# Patient Record
Sex: Male | Born: 1940
Health system: Southern US, Community
[De-identification: ages and names within clinical notes are randomized; demographics above are authoritative.]

## PROBLEM LIST (undated history)

## (undated) DIAGNOSIS — Z9889 Other specified postprocedural states: Secondary | ICD-10-CM

## (undated) DIAGNOSIS — C189 Malignant neoplasm of colon, unspecified: Secondary | ICD-10-CM

## (undated) DIAGNOSIS — R079 Chest pain, unspecified: Secondary | ICD-10-CM

## (undated) DIAGNOSIS — E7800 Pure hypercholesterolemia, unspecified: Secondary | ICD-10-CM

## (undated) DIAGNOSIS — K219 Gastro-esophageal reflux disease without esophagitis: Secondary | ICD-10-CM

## (undated) DIAGNOSIS — Z8739 Personal history of other diseases of the musculoskeletal system and connective tissue: Secondary | ICD-10-CM

## (undated) DIAGNOSIS — Z8719 Personal history of other diseases of the digestive system: Secondary | ICD-10-CM

## (undated) DIAGNOSIS — I2510 Atherosclerotic heart disease of native coronary artery without angina pectoris: Secondary | ICD-10-CM

## (undated) DIAGNOSIS — I10 Essential (primary) hypertension: Secondary | ICD-10-CM

## (undated) HISTORY — DX: Malignant neoplasm of colon, unspecified: C18.9

## (undated) HISTORY — PX: APPENDECTOMY: SHX54

## (undated) HISTORY — DX: Chest pain, unspecified: R07.9

## (undated) HISTORY — PX: CORONARY ANGIOPLASTY: SHX604

## (undated) HISTORY — PX: HERNIA REPAIR: SHX51

## (undated) HISTORY — DX: Personal history of other diseases of the digestive system: Z87.19

## (undated) HISTORY — PX: OTHER SURGICAL HISTORY: SHX169

## (undated) HISTORY — PX: PARTIAL COLECTOMY: SHX5273

## (undated) HISTORY — PX: AMPUTATION OF REPLICATED TOES: SHX1136

## (undated) HISTORY — PX: CARDIAC CATHETERIZATION: SHX172

## (undated) HISTORY — PX: TONSILLECTOMY: SUR1361

## (undated) HISTORY — DX: Other specified postprocedural states: Z98.890

---

## 1997-11-16 DIAGNOSIS — C189 Malignant neoplasm of colon, unspecified: Secondary | ICD-10-CM

## 1997-11-16 HISTORY — DX: Malignant neoplasm of colon, unspecified: C18.9

## 2002-10-22 ENCOUNTER — Inpatient Hospital Stay (HOSPITAL_COMMUNITY): Admission: EM | Admit: 2002-10-22 | Discharge: 2002-10-31 | Payer: Self-pay | Admitting: Emergency Medicine

## 2002-10-22 ENCOUNTER — Encounter: Payer: Self-pay | Admitting: Emergency Medicine

## 2005-05-04 ENCOUNTER — Emergency Department (HOSPITAL_COMMUNITY): Admission: EM | Admit: 2005-05-04 | Discharge: 2005-05-04 | Payer: Self-pay | Admitting: Emergency Medicine

## 2009-01-18 ENCOUNTER — Ambulatory Visit: Payer: Self-pay | Admitting: Cardiovascular Disease

## 2009-01-18 ENCOUNTER — Inpatient Hospital Stay (HOSPITAL_COMMUNITY): Admission: AD | Admit: 2009-01-18 | Discharge: 2009-01-21 | Payer: Self-pay | Admitting: Cardiovascular Disease

## 2009-02-09 DIAGNOSIS — Z9889 Other specified postprocedural states: Secondary | ICD-10-CM | POA: Insufficient documentation

## 2009-02-09 DIAGNOSIS — R079 Chest pain, unspecified: Secondary | ICD-10-CM

## 2009-02-09 DIAGNOSIS — C189 Malignant neoplasm of colon, unspecified: Secondary | ICD-10-CM | POA: Insufficient documentation

## 2009-03-01 ENCOUNTER — Ambulatory Visit: Payer: Self-pay | Admitting: Cardiology

## 2009-04-04 ENCOUNTER — Ambulatory Visit: Payer: Self-pay | Admitting: Cardiology

## 2011-02-26 LAB — CBC
HCT: 41 % (ref 39.0–52.0)
HCT: 41 % (ref 39.0–52.0)
Hemoglobin: 14.4 g/dL (ref 13.0–17.0)
Hemoglobin: 14.5 g/dL (ref 13.0–17.0)
Hemoglobin: 14.5 g/dL (ref 13.0–17.0)
MCHC: 35.4 g/dL (ref 30.0–36.0)
MCHC: 35.4 g/dL (ref 30.0–36.0)
MCV: 93.6 fL (ref 78.0–100.0)
Platelets: 202 10*3/uL (ref 150–400)
Platelets: 211 10*3/uL (ref 150–400)
RBC: 4.34 MIL/uL (ref 4.22–5.81)
RDW: 13.2 % (ref 11.5–15.5)
RDW: 13.2 % (ref 11.5–15.5)
RDW: 13.5 % (ref 11.5–15.5)
WBC: 8.9 10*3/uL (ref 4.0–10.5)

## 2011-02-26 LAB — LIPID PANEL
HDL: 21 mg/dL — ABNORMAL LOW (ref >39)
Total CHOL/HDL Ratio: 8.6 RATIO
VLDL: 21 mg/dL (ref 0–40)

## 2011-02-26 LAB — DIFFERENTIAL
Basophils Absolute: 0 10*3/uL (ref 0.0–0.1)
Lymphocytes Relative: 26 % (ref 12–46)
Lymphs Abs: 2.3 10*3/uL (ref 0.7–4.0)
Monocytes Absolute: 0.9 10*3/uL (ref 0.1–1.0)
Neutro Abs: 5.5 10*3/uL (ref 1.7–7.7)

## 2011-02-26 LAB — BASIC METABOLIC PANEL
BUN: 6 mg/dL (ref 6–23)
BUN: 7 mg/dL (ref 6–23)
BUN: 9 mg/dL (ref 6–23)
CO2: 25 mEq/L (ref 19–32)
Calcium: 8.5 mg/dL (ref 8.4–10.5)
Calcium: 8.6 mg/dL (ref 8.4–10.5)
Calcium: 8.9 mg/dL (ref 8.4–10.5)
Chloride: 105 mEq/L (ref 96–112)
Chloride: 105 mEq/L (ref 96–112)
Creatinine, Ser: 0.87 mg/dL (ref 0.4–1.5)
GFR calc Af Amer: >60 mL/min (ref >60)
GFR calc Af Amer: >60 mL/min (ref >60)
GFR calc non Af Amer: >60 mL/min (ref >60)
GFR calc non Af Amer: >60 mL/min (ref >60)
GFR calc non Af Amer: >60 mL/min (ref >60)
Glucose, Bld: 102 mg/dL — ABNORMAL HIGH (ref 70–99)
Glucose, Bld: 107 mg/dL — ABNORMAL HIGH (ref 70–99)
Glucose, Bld: 111 mg/dL — ABNORMAL HIGH (ref 70–99)
Potassium: 3.5 mEq/L (ref 3.5–5.1)
Potassium: 3.9 mEq/L (ref 3.5–5.1)
Sodium: 135 mEq/L (ref 135–145)
Sodium: 138 mEq/L (ref 135–145)
Sodium: 138 mEq/L (ref 135–145)

## 2011-02-26 LAB — CARDIAC PANEL(CRET KIN+CKTOT+MB+TROPI)
CK, MB: 174.4 ng/mL — ABNORMAL HIGH (ref 0.3–4.0)
Relative Index: 16.6 — ABNORMAL HIGH (ref 0.0–2.5)
Relative Index: 2.5 (ref 0.0–2.5)
Total CK: 130 U/L (ref 7–232)
Troponin I: 16.7 ng/mL (ref 0.00–0.06)
Troponin I: 21.95 ng/mL (ref 0.00–0.06)
Troponin I: 3.69 ng/mL (ref 0.00–0.06)

## 2011-03-31 NOTE — Discharge Summary (Signed)
Brandon [name], Brandon [name]             ACCOUNT NO.:  [id number]   MEDICAL RECORD NO.:  [id number]          PATIENT TYPE:  INP   LOCATION:  2032                         FACILITY:  [facility]   PHYSICIAN:  Verne Carrow, MDDATE OF BIRTH:  07-01-1941   DATE OF ADMISSION:  01/18/2009  DATE OF DISCHARGE:  01/21/2009                               DISCHARGE SUMMARY   PRIMARY CARDIOLOGIST:  Verne Carrow, MD   PRIMARY CARE PHYSICIAN:  The patient has none.   PROCEDURES PERFORMED DURING HOSPITALIZATION:  Cardiac catheterization,  dated January 18, 2009, performed by Dr. Verne Carrow, revealing  triple-vessel native coronary artery disease with total occlusion of the  obtuse marginal 1 and severe stenosis of the obtuse marginal 2.  A.  Successful percutaneous coronary intervention and placement of a  drug-eluting stent XIENCE 2.5 x 12 mm to the obtuse marginal 2 and  obtuse marginal 1.   FINAL DISCHARGE DIAGNOSES:  1. Non-Q-wave myocardial infarction.  2. Coronary artery disease.      a.     Status post cardiac catheterization, dated January 18, 2009,       revealing left anterior descending serial 30% lesions proximal,       serial 40% lesions mid, plaque distal; diagonal 1, ostial 90%       small vessel circ; obtuse marginal 1, 100% occlusion, moderate       size; obtuse marginal 2 plaque in the proximal portion, 95%       stenosis in the distal vessel with diffuse disease beyond the       tight stenosis; right coronary artery 50% proximal stenosis,       serial 30% mid, large and dominant, serial 30% distal; and       posterior descending artery 60%, posterolateral plaquing noted.       No wall motion abnormality with an ejection fraction of 65%.  3. Tobacco abuse.  4. Recent hernia repair.  5. Colon cancer.   HOSPITAL COURSE:  This is a 70 year old Caucasian male with no prior  cardiac history who presented to [facility] complaining of chest  discomfort, radiating up  into his jaw with nausea and diaphoresis.  The  patient was seen at [facility] ER and found to have a troponin of 0.59  with a CK-MB of 31 with inferolateral ST depression in the EKG.  The  patient was transferred to [facility] after speaking with Charlton Haws by [facility] ER physician.  The patient was started on  nitroglycerin.  On arrival, the patient continued to have jaw pain and  chest pain and the patient was sent to cardiac catheterization  emergently after being assessed by Dorian Pod, nurse practitioner.  Dr. Clifton James did perform cardiac catheterization with results described  above.  The patient tolerated the procedure well without evidence of  bleeding, hematoma, or infection.  The patient had good recovery post  catheterization.  The patient is being seen by cardiac rehab prior to  discharge and will follow up with them as an outpatient.  The patient  was seen and examined by Dr. Verne Carrow on day of  discharge.  The patient was without chest pain.  He will follow up with Dr. Clifton James  in his office in 3 weeks.  He will be placed on aspirin, Plavix,  Lopressor, and Crestor prior to discharge.  He has been counseled on  smoking cessation as well.   DISCHARGE LABORATORY DATA:  Sodium 135, potassium 3.5, chloride 105, CO2  27, BUN 9, creatinine 0.94, glucose 91.  CK 130, troponin 3.69, peak  troponin 21.95.   DISCHARGE VITAL SIGNS:  Blood pressure 105/53, pulse 52, respirations  16, temperature 97.9, O2 sat 93% on room air.   Discharge EKG revealing normal sinus rhythm without evidence of  ischemia.   DISCHARGE MEDICATIONS:  1. Plavix 75 mg daily (new prescription provided).  2. Aspirin 325 daily.  3. Lopressor 25 mg twice a day (new prescription provided).  4. Crestor 40 mg daily.  5. Nitroglycerin 0.4 mg daily p.r.n. chest discomfort.   ALLERGIES:  No known drug allergies.   FOLLOWUP PLANS AND APPOINTMENTS:  1. The patient is scheduled with Dr.  Verne Carrow on February 14, 2009, at 9:30.  2. The patient has been given post cardiac catheterization      instructions with particular emphasis on the right groin site for      evidence of bleeding, hematoma, or signs of infection.  3. The patient has been given smoking cessation counseling.  4. The patient will need to have lipids and LFTs drawn in 6 months      with institution of Crestor.  5. The patient will follow up with cardiac rehab as an outpatient.  6. The patient will need to find primary care physician for continued      medical management.   Time spent with the patient to include physician time 35 minutes.      Bettey Mare. Lyman Bishop, NP      Verne Carrow, MD  Electronically Signed    KML/MEDQ  D:  01/21/2009  T:  01/21/2009  Job:  [id number]

## 2011-03-31 NOTE — Assessment & Plan Note (Signed)
[facility]                          [facility] OFFICE NOTE   Brandon [name], Brandon [name]                    MRN:          [id number]  DATE:04/04/2009                            DOB:          08-Dec-1940    PRIMARY CARE PHYSICIAN:  None.   REASON FOR VISIT:  Scheduled followup.   HISTORY OF PRESENT ILLNESS:  Brandon [name] returns for a followup visit,  having last been seen in April.  He reports overall doing fairly well  without any active chest pain.  He has not had to use any nitroglycerin.  He states has been exercising perhaps 20 minutes a day without  limitation.  He reports compliance with his medication and still prefers  a fairly minimal approach to advancing medical therapy.  He had a  followup lipid profile and liver function tests, which revealed a total  cholesterol 81, triglycerides 84, HDL 19 and LDL 45.  His liver function  tests were normal.  I reviewed these with him today.   ALLERGIES:  No known drug allergies.   MEDICATIONS:  1. Aspirin 325 mg p.o. daily.  2. Plavix 75 mg p.o. daily.  3. Crestor 40 mg p.o. daily.  4. Lopressor 12.5 mg p.o. b.i.d.  5. Sublingual nitroglycerin 0.4 mg p.r.n.  6. Pepcid OTC p.r.n.   REVIEW OF SYSTEMS:  As outlined above.  The patient also complains of  constipation, which is relatively new.  He has also had some trouble  with insomnia.  He reports intermittent erectile dysfunction.  He does  not have a primary care Brandon [name] at this time.  Otherwise systems  reviewed  are negative.   PHYSICAL EXAMINATION:  VITAL SIGNS:  Blood pressure is 116/81, heart  rate is 71, weight is 242 pounds down from 255.  GENERAL:  This is an obese male, in no acute distress.  HEENT:  Conjunctivae are normal.  Oropharynx is clear.  NECK:  Supple.  No elevated jugular venous pressure.  No loud bruits or  thyromegaly.  LUNGS:  Clear without labored breathing at rest.  CARDIAC:  Reveals a regular rate and rhythm.   No pathologic murmur or S3  gallop.  ABDOMEN:  Soft and nontender.  EXTREMITIES:  Exhibit no significant pitting edema.  Distal pulses are  2+.   IMPRESSION AND RECOMMENDATIONS:  1. Cardiovascular disease, primarily involving the first and second      obtuse marginal branches, status post drug-eluting stent placement      and thrombectomy in the setting of a non-ST-elevation myocardial      infarction in March of 2010.  He has residual mild-to-moderate      disease within the major epicardial vessels and overall preserved      left ventricular systolic function without active angina.  Medical      therapy will be continued.  I asked him to advance his exercise      regimen as tolerated and also continue to work on weight loss.  We      will plan to see him back over the next 6 months.  2. Hyperlipidemia, aggressively controlled on  high-dose Crestor.  I      would actually like to cut his Crestor back to 20 mg daily to see      if this improves his constipation symptoms.  His HDL remains very      low and we did talk about this today.  I discussed Niaspan with      him, although he was not interested in adding any further      medications to his regimen.  3. Health maintenance.  I reminded Brandon [name] again that he needs      to establish with a primary care Brandon [name].  He states that he will      look into this further.     Brandon Sidle, MD  Electronically Signed    SGM/MedQ  DD: 04/04/2009  DT: 04/04/2009  Job #: [id number]

## 2011-03-31 NOTE — H&P (Signed)
NAME:  VALE, MOUSSEAU NO.:  [id number]   MEDICAL RECORD NO.:  [id number]          PATIENT TYPE:  INP   LOCATION:  2913                         FACILITY:  [facility]   PHYSICIAN:  Verne Carrow, MDDATE OF BIRTH:  1941-03-24   DATE OF ADMISSION:  01/18/2009  DATE OF DISCHARGE:                              HISTORY & PHYSICAL   PRIMARY CARDIOLOGIST NEW:  No primary care physician.   Mr. Barrows is a 70 year old Caucasian gentleman who initially  presented to [facility] complaining of chest discomfort radiating  up into his jaw with nausea and diaphoresis.  He presented to [facility], was seen there and found to have a troponin of 0.59 with a CK-  MB of 31 and inferolateral ST depression in EKG.  Physician at [facility]  spoke with Dr. Charlton Haws.  The patient was transferred to [facility]  Emergency Room with ongoing chest discomfort, jaw discomfort, and  diaphoresis.  On arrival to [facility] he was placed on a  telemetry unit and continued to have jaw pain.  Nitroglycerin dose was  titrated up.  The patient was brought urgently to the cath lab for  further evaluation.   PAST MEDICAL HISTORY:  Tobacco abuse; hernia repair; colon cancer,  status post surgery.  No previous cardiac history.   SOCIAL HISTORY:  He lives in [place].  He is divorced.  He is retired.  He  smokes one and half-pack cigarettes a day and drinks socially.  No known  history of coronary artery disease in his family.   REVIEW OF SYSTEMS:  Positive for chills, sweats, chest pain, jaw  discomfort, chest pain radiating through to his back, nausea and  vomiting.  All other systems reviewed and negative.   ALLERGIES:  No known drug allergies.  It is unclear at this time whether  or not the patient is on any prescription medications.   PHYSICAL EXAMINATION:  VITAL SIGNS:  Blood pressure 129/75, sat 98% on 2  liters, heart rate 69, respirations 20.  GENERAL:  The patient  currently is rating his jaw discomfort of 3 on a  scale of 1 to 10, also have some discomfort into his neck.  HEENT:  Unremarkable.  NECK:  Supple without JVD.  CARDIOVASCULAR:  S1 and S2.  Regular rate and rhythm.  LUNGS:  Clear to auscultation bilaterally.  ABDOMEN:  Soft, nontender, positive bowel sounds.  LOWER EXTREMITIES:  Without clubbing, cyanosis, or edema.  NEUROLOGICALLY:  The patient is alert and oriented x3.   Chest x-ray results from [facility] are pending at this time.  The patient  has significant ST depression in inferolateral leads at a rate of 103.  Lab work obtained.  Note this lab work is a result of blood work drawn  at [facility].  BNP 77, BUN 6, creatinine 1.0.  Hematocrit 49.5,  hemoglobin 17.4, glucose is elevated at 131, platelet count 271,000.  Potassium is 3.9, sodium 133, troponin 0.54.   IMPRESSION:  Non-ST elevated myocardial infarction.  The patient with  ongoing chest discomfort, currently on heparin and nitroglycerin drip.  The patient  is being taken urgently to the cath lab by Dr. Verne Carrow for further evaluation/intervention.  The risks and benefits of  cardiac catheterization have been discussed with the patient.  The  patient agrees to proceed with cardiac catheterization.      Dorian Pod, ACNP      Verne Carrow, MD  Electronically Signed    MB/MEDQ  D:  01/18/2009  T:  01/19/2009  Job:  [id number]

## 2011-03-31 NOTE — Assessment & Plan Note (Signed)
[facility]                          [facility]ICE NOTE   RAS, KOLLMAN                    MRN:          [id number]  DATE:03/01/2009                            DOB:          1941-02-27    PRIMARY CARE PHYSICIAN:  None.   REASON FOR VISIT:  Hospital followup.   HISTORY OF PRESENT ILLNESS:  This is my first meeting with Mr.  Brandon [name].  He is a 70 year old gentleman with a history of tobacco use  who presented with a non-ST-elevation myocardial infarction to [facility] back in March.  He underwent a diagnostic cardiac  catheterization by Dr. Clifton James with findings of essentially mild-to-  moderate atherosclerosis within the major epicardial vessels but severe  disease involving the first and second obtuse marginal branch.  He  underwent drug-eluting stent placement and thrombectomy to treat both  the first and second obtuse marginals and has overall left ventricular  ejection fraction of 65%.  He was initiated on medical therapy and  follows-up today.  He states in general that he has had about 10-pound  weight gain since discharge and has had less energy.  He has been trying  to do some walking and has not yet started cardiac rehabilitation.  He  has also had indigestion.  Today, we talked about using over-the-counter  Pepcid, as he needs to continue dual antiplatelet therapy and also we  talked about cutting back his Lopressor to 12.5 mg p.o. b.i.d.  He has  had no bleeding problems, otherwise, no palpitations and no syncope.  We  also discussed his obtaining a primary care physician.   ALLERGIES:  No known drug allergies.   PRESENT MEDICATIONS:  1. Aspirin 325 mg p.o. daily.  2. Plavix 75 mg p.o. daily.  3. Crestor 40 mg p.o. daily.  4. Metoprolol 25 mg p.o. b.i.d.  5. Sublingual nitroglycerin 0.4 mg p.r.n.   REVIEW OF SYSTEMS:  As described above.  Otherwise, reviewed negative.   PHYSICAL EXAMINATION:  VITAL  SIGNS:  Blood pressure is 140/80, heart  rate is 59, weight is 255 pounds.  GENERAL:  This is an obese male in no acute distress.  HEENT:  Conjunctivae is normal.  Oropharynx is clear.  NECK:  Supple.  No elevated jugular venous pressure.  No loud bruits.  LUNGS:  Clear, breathing at rest.  CARDIAC:  Regular rate and rhythm.  No murmur, rub, or gallop.  ABDOMEN:  Soft and nontender.  Good bowel sounds.  EXTREMITIES:  No significant pitting edema.   IMPRESSION AND RECOMMENDATION:  1. Coronary artery disease, with severe disease involving the first      and second obtuse marginal branches, status post drug-eluting stent      placement and thrombectomy, in the setting of a non-ST-elevation      myocardial infarction in March 2010.  There is residual mild-to-      moderate disease within the major epicardial vessels and a left      ventricular ejection fraction of 65%.  He is not reporting any      angina at this time.  I  explained him the importance of continuing      dual antiplatelet therapy.  I suspect, however, he is having some      reflux symptoms related to this.  I have asked him to use Pepcid      over-the-counter.  I also suggested that he decrease his Lopressor      to 12.5 mg twice daily to see if, perhaps, relative bradycardia is      leading to some of his symptoms of fatigue.  He plans to begin      cardiac rehabilitation, and I will see him back over the next month      with a repeat lipid profile on Crestor.  2. History of tobacco abuse, smoking cessation, as already been      discussed.     Jonelle Sidle, MD  Electronically Signed    SGM/MedQ  DD: 03/01/2009  DT: 03/02/2009  Job #: [id number]

## 2011-03-31 NOTE — Cardiovascular Report (Signed)
NAME:  Brandon [name], Brandon [name] NO.:  [id number]   MEDICAL RECORD NO.:  [id number]          PATIENT TYPE:  INP   LOCATION:  2913                         FACILITY:  [facility]   PHYSICIAN:  Verne Carrow, MDDATE OF BIRTH:  Apr 11, 1941   DATE OF PROCEDURE:  01/18/2009  DATE OF DISCHARGE:                            CARDIAC CATHETERIZATION   PROCEDURE PERFORMED:  1. Left heart catheterization.  2. Selective coronary angiography.  3. Left ventricular angiogram.  4. Percutaneous coronary intervention, followed by thrombectomy of the      first obtuse marginal coronary artery.  5. Placement of a Xience drug-eluting stent in the first obtuse      marginal coronary artery.  6. Percutaneous coronary intervention with placement of a Xience drug-      eluting stent in the second obtuse marginal coronary artery.   OPERATOR:  Verne Carrow, MD   INDICATIONS:  Non-ST-elevation myocardial infarction in a 70 year old  patient with a history of obesity and tobacco abuse who was transferred  from [facility] Emergency [facility] and was having active chest  and jaw pain when he arrived in our hospital.  His initial troponin was  0.54.   PROCEDURE IN DETAIL:  The patient was brought to the inpatient cardiac  catheterization laboratory on an urgent basis.  The patient signed  informed consent prior to coming into the cath lab.  The right groin was  prepped and draped in a sterile fashion.  Lidocaine 1% was used for  local anesthesia.  A 6-French sheath was inserted into the right femoral  artery without difficulty.  Standard diagnostic catheter was used to  perform selective coronary angiography.  A pigtail catheter was used to  perform a left ventricular angiogram.  There was no significant pressure  gradient measured across the aortic valve.   At this point, we elected to proceed to intervention of the first obtuse  marginal coronary artery.  An XB 3.5 guiding catheter  was used to  selectively engage the left main coronary artery.  A Cougar  intracoronary wire was passed down into the first obtuse marginal branch  beyond the area of total occlusion.  A Fetch catheter was used for  thrombectomy.  Some flow was established down the vessel at this point.  A 2.5 x 15-mm balloon was used for predilatation of the lesion.  A 2.5 x  18-mm Xience drug-eluting stent was deployed in the area of tightest  stenosis.  Due to vessel size, we elected not to perform a  postdilatation of this lesion.  There was TIMI III flow down the vessel  following the stent placement.   At this point, we moved our attention to the second obtuse marginal  branch that had a 95-99% stenosis in the distal portion.  A Cougar  intracoronary wire was passed down the length of the second obtuse  marginal coronary artery without difficulty.  A 2.5 x 8-mm balloon was  inflated in the area of tightest stenosis.  A 2.5 x 12-mm Xience drug-  eluting stent was deployed in the area of tightest stenosis.  Once  again, I elected  not to post dilate the stent secondary to the small  vessel size.   The patient tolerated the interventions well.  An Angiomax bolus was  given prior to the start of the intervention and an Angiomax drip was  continued during the procedure.  The patient was given 600 mg of Plavix  on the cath table.  He was taken to the CCU where his sheath will be  removed in 2 hours.  He will have 4 hours of bedrest.   ANGIOGRAPHIC FINDINGS:  1. The left main coronary artery had no evidence of disease.  2. The left anterior descending had serial 30% lesions in the proximal      portion, serial 40% lesions in the midportion, and plaque in the      distal portion.  First diagonal was a small vessel that had an      ostial 90% stenosis.  3. The circumflex artery was a large vessel that had a moderate-sized      first marginal branch that had 100% occlusion in the proximal to       midportion of the vessel.  The second obtuse marginal was a very      large branch that had plaque in the proximal portion and a 95%      tight stenosis in the distal portion of the vessel.  There was also      diffuse disease noted beyond the tight area of stenosis in the      distal part of the second obtuse marginal branch.  4. The right coronary artery is a large dominant vessel that had a 50%      long tubular proximal stenosis and serial 30% lesions throughout      the mid and distal vessel.  The posterior descending artery was a      moderate-sized vessel that had a 60% discrete stenosis.  The      posterolateral branch had plaque disease.  5. Left ventricular angiogram was performed in the RAO projection and      showed normal systolic function of the left ventricle with no wall      motion abnormalities.  Ejection fraction was estimated at 65%.   HEMODYNAMIC DATA:  Central aortic pressure 121/68.  Left ventricular  pressure 126/9.  End-diastolic pressure 15.   IMPRESSION:  1. Triple-vessel native coronary artery disease.  2. Total occlusion of first obtuse marginal coronary artery.  3. Severe stenosis of the second obtuse marginal coronary artery.  4. Normal left ventricular systolic function.  5. Successful percutaneous intervention with placement of a Xience      drug-eluting stent in the first obtuse marginal coronary artery.  6. Successful percutaneous coronary intervention with placement of a      Xience drug-eluting stent in the second obtuse marginal coronary      artery.   RECOMMENDATIONS:  The patient will be admitted to the CCU tonight and  will be continued on aspirin and Plavix for at least 1 year.  We will  also start a beta-blocker and a statin.  The patient has been counseled  on the importance of tobacco cessation.       Verne Carrow, MD  Electronically Signed     CM/MEDQ  D:  01/18/2009  T:  01/20/2009  Job:  [id number]

## 2011-04-03 NOTE — Op Note (Signed)
NAMEQUENTIN, SHOREY                         ACCOUNT NO.:  [id number]   MEDICAL RECORD NO.:  [id number]                   PATIENT TYPE:  INP   LOCATION:  1610                                 FACILITY:  [facility]   PHYSICIAN:  Adolph Pollack, M.D.            DATE OF BIRTH:  11/30/1940   DATE OF PROCEDURE:  10/22/2002  DATE OF DISCHARGE:                                 OPERATIVE REPORT   PREOPERATIVE DIAGNOSIS:  Partial bowel obstruction secondary to incarcerated  ventral incisional hernia.   POSTOPERATIVE DIAGNOSIS:  Partial bowel obstruction secondary to  incarcerated ventral incisional hernia.   PROCEDURE:  Exploratory of hernia and lysis of adhesions, repair of hernia  with mesh.   SURGEON:  Adolph Pollack, M.D.   INDICATIONS:  General.   DESCRIPTION OF PROCEDURE:  The patient is a 70 year old male who  approximately two years underwent a partial colectomy for colon cancer.  He  has not received follow up colonoscopy.  He began having sharp abdominal  pain along with distention, a painful bulge, nausea and vomiting.  He has a  partial small bowel obstruction and obvious incarcerated periumbilical  incisional hernia.  He is now brought to the emergency room for emergency  laparotomy.  He did look ill with a white count of [phone].  Concern was he  may have strangulated intestine.   He was brought to the operating room and placed supine on the operating  table, and a general anesthetic was administered.  Foley catheter was placed  in his bladder, and the abdomen was sterilely prepped and draped.  The  previous midline incision was reincised sharply and carried down through the  subcutaneous tissue.  I then excised some fascia and removed some permanent  sutures.  I carefully entered the peritoneal cavity and noted significant  adhesions between the small bowel and the underlying abdominal wall.  There  was also small bowel that was incarcerated up in the hernia defect in  this  periumbilical region.  I carefully performed lysis of adhesions and then  extended the fascial incision up around the hernia defect and released the  incarcerated small bowel which was viable.  I continued to perform  significant lysis of adhesions freeing up the small bowel from the abdominal  wall  and examining it.  No enterotomies were made.  Three small serosal  tears were made, and these were repaired with interrupted Vicryl sutures.   Once the adhesiolysis had been done, and the small bowel had been freed up,  I could easily see the transition zone but could not dilate the small bowel  and a normal small bowel at the point of incarceration.  Once again, the  bowel at this point appeared viable.   Next, I identified the fascia and separated the subcutaneous fat from the  fascia creating flaps laterally, superiorly and inferiorly.  I then was able  to close the fascia  and hernia defect primarily with a running #1 Novafil  suture.  I then placed a double layer of polypropylene mesh over the closure  for an on-lay mesh repair.  I anchored it to the fascia and the periphery  with interrupted 0 Novafil sutures and then in the midportion with spiral  tacks.  I did leave some laxity in the midportion.  I had more than adequate  overlap in all directions.   Next, I irrigated the subcutaneous tissue and the mesh with antibiotic  solution and let this soak for awhile.  I then evacuated this lotion and  placed two stab wounds in the right and left lower quadrant and put drains  in the subcutaneous space.  These were anchored to the skin with 3-0 nylon  suture.  The subcutaneous tissue was then closed in a running fashion with 0  Vicryl suture over the drains and over the mesh.  The skin was then closed  with staples.  Sterile dressings were applied.   He tolerated the procedure without any apparent complications.  He was taken  to the recovery room in satisfactory condition.                                                Adolph Pollack, M.D.    Kari Baars  D:  10/22/2002  T:  10/22/2002  Job:  [id number]

## 2011-04-03 NOTE — H&P (Signed)
Brandon [name], Brandon [name]                         ACCOUNT NO.:  [id number]   MEDICAL RECORD NO.:  [id number]                   PATIENT TYPE:  EMS   LOCATION:  ED                                   FACILITY:  [facility]   PHYSICIAN:  Adolph Pollack, M.D.            DATE OF BIRTH:  17-Apr-1941   DATE OF ADMISSION:  10/22/2002  DATE OF DISCHARGE:                                HISTORY & PHYSICAL   REASON FOR ADMISSION:  Abdominal pain, nausea and vomiting.   HISTORY OF PRESENT ILLNESS:  The patient is a 70 year old male who back in  2001 underwent a left or sigmoid colectomy for colon cancer up in [place].  For  the past two or three months, he has been having intermittent sharp pains in  the periumbilical region.  He ate a large lunch yesterday and began having  severe sharp abdominal pain associated with abdominal bloating followed by  perfuse vomiting.  He eventually presented to the emergency department very  early this morning for evaluation.  He continued to vomit and have pain  while in the emergency department and has been given a significant amount of  pain medication as well as Ativan.  He is somewhat sedated but able to  discuss certain things with me.  His girlfriend and his son are helping with  the history.  He has felt feverish.  He is having some chills as well.   PAST MEDICAL HISTORY:  Colon cancer.   PAST SURGICAL HISTORY:  Partial colectomy.   ALLERGIES:  None.   MEDICATIONS:  None.   SOCIAL HISTORY:  He smokes 1-1/2 to 1 pack of cigarettes a day and has done  so for about 10 years.  Occasionally, he has an alcoholic beverage.  He is a  [employer] and is fairly busy.  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   REVIEW OF SYSTEMS:  CARDIOVASCULAR:  He denies any hypertension or heart  disease.  PULMONARY:  He denies emphysema, pneumonia, tuberculosis.  GI:  Denies peptic ulcer disease, hepatitis, diverticulitis.  GU:   Denies any  kidney problems.  He does state that he has a little bit of difficulty  starting his stream now.  ENDOCRINE:  No diabetes or thyroid problems.  NEUROLOGICAL:  No strokes or seizures.  HEMATOLOGIC:  No bleeding disorders,  blood clots, or transfusion.   PHYSICAL EXAMINATION:  GENERAL:  He is a sedated male.  However, he will  wake up and answer some questions slowly.  SKIN:  Flushed and warm.  EYES:  Extraocular muscles intact.  Sclerae clear.  NECK:  Supple without palpable masses.  CARDIOVASCULAR:  Heart demonstrates a regular rate and rhythm with no murmur  heard.  RESPIRATORY:  Breath sounds equal and clear respirations.  ABDOMEN:  Soft and mildly obese.  There is a midline incision.  In the  periumbilical region, there is a palpable mass that is slightly reducible.  It is difficult to discern tenderness as he is so sedated.  There are  hypoactive bowel sounds noted.  GENITOURINARY:  No hernias and no penile lesions.  MUSCULOSKELETAL:  Full range of motion.  No cyanosis or edema in the  extremities.  NEUROLOGICAL:  Again, he is sedated but appears to have normal muscle  strength.   LABORATORY DATA:  His white cell count is 15,000 with a leftward shift.  Hemoglobin 16.  Amylase, lipase normal.  CMET demonstrates a glucose of 141  but otherwise within normal limits.   IMAGING STUDIES:  Three-way abdominal x-rays:  No acute chest disease.  Nonspecific gas pattern, no free air.   CT scan of the abdomen demonstrates dilated small intestine all the way up  to his incisional midline hernia site where there is inflamed intestine  present and then decompressed intestine distal to this.   IMPRESSION:  Incarcerated ventral incisional hernia--it has been out for  quite a while and I have concerns about strangulation.  It is only partially  reducible clinically.   PLAN:  I suggested exploratory laparoscopy with examination of the bowel  with possible bowel resection and  hernia repair.  I did discuss the other  option of being observation but felt that if he became more ill then that  would indicate gangrene and he may have a more complicated course.  I went  over the procedure and the risks with him, his girlfriend, and his son.  They include but are not limited to bleeding, infection, anesthesia,  accidents to internal abdominal organs such as intestine, bladder, etc., as  well as recurrent hernia.  I did talk about using mesh but if there is any  significant inflammation or infection or bowel resection that needs to be  done, I told them I would just have to perform a primary repair and accept  the fact that there was a higher recurrence rate with this.  They seem to  understand and are agreeable to proceeding.                                               Adolph Pollack, M.D.    Kari Baars  D:  10/22/2002  T:  10/22/2002  Job:  [id number]

## 2011-04-03 NOTE — Discharge Summary (Signed)
   NAMESTEVIN, [name]                         ACCOUNT NO.:  [id number]   MEDICAL RECORD NO.:  [id number]                   PATIENT TYPE:  INP   LOCATION:  1610                                 FACILITY:  [facility]   PHYSICIAN:  Adolph Pollack, M.D.            DATE OF BIRTH:  1941/06/18   DATE OF ADMISSION:  10/22/2002  DATE OF DISCHARGE:  10/31/2002                                 DISCHARGE SUMMARY   PRINCIPAL DISCHARGE DIAGNOSES:  1. Incarcerated incisional hernia.  2. Postoperative ileus.   PROCEDURE:  Emergency exploratory laparotomy, reduction and repair of  ventral hernia with mesh and lysis of adhesions on 10/22/02.   REASON FOR ADMISSION:  This is a 70 year old male with acute onset of sharp  periumbilical pain, and he presented to the emergency room having profuse  vomiting.  He was found to have a small bowel obstruction, and a CT scan  demonstrated incarcerated small bowel.  He was admitted and taken to the  operating room for emergency exploration.   HOSPITAL COURSE:  He underwent the above procedure.  Postoperatively, he was  maintained with IV fluids and a nasogastric tube.  He was ambulatory, had  failure good pain control.  He had two Jackson-Pratt drains and these were  able to be removed.  His ileus was slow to resolve, and we clamped his NG  tube when he tolerated that.  That was removed.  He was started on a liquid  diet, and then advanced to a solid diet.  By his ninth postoperative day, he  was having bowel movements, passing gas, tolerating a solid diet, wound  looked good, and he was able to be discharged.   DISPOSITION:  Discharged to home on 10/31/02.  Staples were removed.   ACTIVITY:  He is given strict activity restrictions.   DISCHARGE MEDICATIONS:  Vicodin for pain.   FOLLOWUP:  He will come back and see me in two to three weeks.   CONDITION ON DISCHARGE:  He is discharged in satisfactory condition.           Adolph Pollack, M.D.    Kari Baars  D:  11/06/2002  T:  11/06/2002  Job:  [id number]

## 2011-05-14 ENCOUNTER — Encounter: Payer: Self-pay | Admitting: Cardiology

## 2012-09-30 ENCOUNTER — Encounter (INDEPENDENT_AMBULATORY_CARE_PROVIDER_SITE_OTHER): Payer: Self-pay | Admitting: *Deleted

## 2014-07-31 ENCOUNTER — Encounter (INDEPENDENT_AMBULATORY_CARE_PROVIDER_SITE_OTHER): Payer: Self-pay | Admitting: *Deleted

## 2014-08-27 ENCOUNTER — Ambulatory Visit (INDEPENDENT_AMBULATORY_CARE_PROVIDER_SITE_OTHER): Payer: Medicare HMO | Admitting: Internal Medicine

## 2014-08-27 ENCOUNTER — Encounter (INDEPENDENT_AMBULATORY_CARE_PROVIDER_SITE_OTHER): Payer: Self-pay | Admitting: Internal Medicine

## 2014-08-27 VITALS — BP 140/74 | HR 64 | Temp 97.6°F | Ht 70.5 in | Wt 263.6 lb

## 2014-08-27 DIAGNOSIS — Z79899 Other long term (current) drug therapy: Secondary | ICD-10-CM | POA: Insufficient documentation

## 2014-08-27 DIAGNOSIS — C189 Malignant neoplasm of colon, unspecified: Secondary | ICD-10-CM

## 2014-08-27 NOTE — Progress Notes (Addendum)
   Subjective:    Patient ID: Brandon [name], male    DOB: Oct 03, 1941, 73 y.o.   MRN: [id number]  HPI Referred to our office by Dr. Manuella Ghazi screenig colonoscopy. Has not had a colonoscopy in over 8 yrs by Dr. Anthony Sar. Hx is + for colonic carcinoma and (lesion in transverse colon) and underwent a left hemicolectomy.. Last colonoscopy about 8 yrs a go by [facility]. Will get these reports.  Appetite is good. No weight loss. There is no abdominal pain. Usually as a BM daily x 3. No melena or BRRB.   Review of Systems Past Medical History  Diagnosis Date  . Colon cancer   . History of herniorrhaphy   . Chest pain, unspecified     History reviewed. No pertinent past surgical history.  Allergies no known allergies  Current Outpatient Prescriptions on File Prior to Visit  Medication Sig Dispense Refill  . aspirin 325 MG tablet Take 325 mg by mouth daily.        . metoprolol tartrate (LOPRESSOR) 25 MG tablet Take 25 mg by mouth 2 (two) times daily.        . nitroGLYCERIN (NITROSTAT) 0.4 MG SL tablet Place 0.4 mg under the tongue every 5 (five) minutes as needed.         No current facility-administered medications on file prior to visit.        Objective:   Physical Exam  Filed Vitals:   08/27/14 1005  BP: 140/74  Pulse: 64  Temp: 97.6 F (36.4 C)  Height: 5' 10.5" (1.791 m)  Weight: 263 lb 9.6 oz (119.568 kg)  Alert and oriented. Skin warm and dry. Oral mucosa is moist.   . Sclera anicteric, conjunctivae is pink. Thyroid not enlarged. No cervical lymphadenopathy. Lungs clear. Heart regular rate and rhythm.  Abdomen is soft. Bowel sounds are positive. No hepatomegaly. No abdominal masses felt. Obese. Midline scar. No tenderness.  No edema to lower extremities.           Assessment & Plan:  Medication management. Hx of colon cancer in 2001 with left hemicolectomy.  Surveillance colonoscopy

## 2014-08-27 NOTE — Patient Instructions (Signed)
Colonoscopy

## 2014-08-28 ENCOUNTER — Other Ambulatory Visit (INDEPENDENT_AMBULATORY_CARE_PROVIDER_SITE_OTHER): Payer: Self-pay | Admitting: *Deleted

## 2014-08-28 ENCOUNTER — Telehealth (INDEPENDENT_AMBULATORY_CARE_PROVIDER_SITE_OTHER): Payer: Self-pay | Admitting: *Deleted

## 2014-08-28 DIAGNOSIS — Z1211 Encounter for screening for malignant neoplasm of colon: Secondary | ICD-10-CM

## 2014-08-28 NOTE — Telephone Encounter (Signed)
Patient wanted to let you know his metoprolol instructions were wrong on his AVS -- it should be Metoprolol 25 mg 1/2 tab daily -- wants to make sure it is corrected in his chart

## 2014-10-19 ENCOUNTER — Telehealth (INDEPENDENT_AMBULATORY_CARE_PROVIDER_SITE_OTHER): Payer: Self-pay | Admitting: *Deleted

## 2014-10-19 DIAGNOSIS — Z1211 Encounter for screening for malignant neoplasm of colon: Secondary | ICD-10-CM

## 2014-10-19 NOTE — Telephone Encounter (Signed)
Patient needs movi prep 

## 2014-10-23 MED ORDER — PEG-KCL-NACL-NASULF-NA ASC-C 100 G PO SOLR: 1.0000 | Freq: Once | ORAL | Status: DC

## 2014-10-30 ENCOUNTER — Encounter (INDEPENDENT_AMBULATORY_CARE_PROVIDER_SITE_OTHER): Payer: Self-pay

## 2014-11-21 ENCOUNTER — Encounter (HOSPITAL_COMMUNITY)
Admission: RE | Disposition: A | Discharge status: Home / Self Care | Payer: Self-pay | Source: Ambulatory Visit | Attending: Internal Medicine

## 2014-11-21 ENCOUNTER — Encounter (HOSPITAL_COMMUNITY): Payer: Self-pay | Admitting: *Deleted

## 2014-11-21 ENCOUNTER — Ambulatory Visit (HOSPITAL_COMMUNITY)
Admission: RE | Admit: 2014-11-21 | Discharge: 2014-11-21 | Disposition: A | Discharge status: Home / Self Care | Payer: Medicare HMO | Source: Ambulatory Visit | Attending: Internal Medicine | Admitting: Internal Medicine

## 2014-11-21 DIAGNOSIS — E780 Pure hypercholesterolemia: Secondary | ICD-10-CM | POA: Insufficient documentation

## 2014-11-21 DIAGNOSIS — I10 Essential (primary) hypertension: Secondary | ICD-10-CM | POA: Insufficient documentation

## 2014-11-21 DIAGNOSIS — K644 Residual hemorrhoidal skin tags: Secondary | ICD-10-CM | POA: Insufficient documentation

## 2014-11-21 DIAGNOSIS — K649 Unspecified hemorrhoids: Secondary | ICD-10-CM

## 2014-11-21 DIAGNOSIS — K635 Polyp of colon: Secondary | ICD-10-CM | POA: Diagnosis not present

## 2014-11-21 DIAGNOSIS — Z87891 Personal history of nicotine dependence: Secondary | ICD-10-CM | POA: Diagnosis not present

## 2014-11-21 DIAGNOSIS — D125 Benign neoplasm of sigmoid colon: Secondary | ICD-10-CM

## 2014-11-21 DIAGNOSIS — Z888 Allergy status to other drugs, medicaments and biological substances status: Secondary | ICD-10-CM | POA: Diagnosis not present

## 2014-11-21 DIAGNOSIS — Z7982 Long term (current) use of aspirin: Secondary | ICD-10-CM | POA: Diagnosis not present

## 2014-11-21 DIAGNOSIS — Z959 Presence of cardiac and vascular implant and graft, unspecified: Secondary | ICD-10-CM | POA: Diagnosis not present

## 2014-11-21 DIAGNOSIS — I2510 Atherosclerotic heart disease of native coronary artery without angina pectoris: Secondary | ICD-10-CM | POA: Diagnosis not present

## 2014-11-21 DIAGNOSIS — Z1211 Encounter for screening for malignant neoplasm of colon: Secondary | ICD-10-CM | POA: Diagnosis present

## 2014-11-21 DIAGNOSIS — Z85038 Personal history of other malignant neoplasm of large intestine: Secondary | ICD-10-CM

## 2014-11-21 HISTORY — DX: Pure hypercholesterolemia, unspecified: E78.00

## 2014-11-21 HISTORY — DX: Essential (primary) hypertension: I10

## 2014-11-21 HISTORY — PX: COLONOSCOPY: SHX5424

## 2014-11-21 SURGERY — COLONOSCOPY
Anesthesia: Moderate Sedation

## 2014-11-21 MED ORDER — MIDAZOLAM HCL 5 MG/5ML IJ SOLN
INTRAMUSCULAR | Status: AC
  Filled 2014-11-21: qty 10

## 2014-11-21 MED ORDER — MEPERIDINE HCL 50 MG/ML IJ SOLN
INTRAMUSCULAR | Status: DC | PRN
  Administered 2014-11-21 (×2): 25 mg via INTRAVENOUS

## 2014-11-21 MED ORDER — MEPERIDINE HCL 50 MG/ML IJ SOLN
INTRAMUSCULAR | Status: AC
  Filled 2014-11-21: qty 1

## 2014-11-21 MED ORDER — MIDAZOLAM HCL 5 MG/5ML IJ SOLN
INTRAMUSCULAR | Status: DC | PRN
  Administered 2014-11-21: 2 mg via INTRAVENOUS
  Administered 2014-11-21: 1 mg via INTRAVENOUS
  Administered 2014-11-21: 2 mg via INTRAVENOUS

## 2014-11-21 MED ORDER — SODIUM CHLORIDE 0.9 % IV SOLN
INTRAVENOUS | Status: DC
  Administered 2014-11-21: 08:00:00 via INTRAVENOUS

## 2014-11-21 MED ORDER — STERILE WATER FOR IRRIGATION IR SOLN
Status: DC | PRN
  Administered 2014-11-21: 08:00:00

## 2014-11-21 NOTE — Op Note (Addendum)
COLONOSCOPY PROCEDURE REPORT  PATIENT:  Brandon [name]  MR#:  [id number] Birthdate:  1941/10/17, 74 y.o., male Endoscopist:  Dr. Rogene Houston, MD Referred By:  Dr. Monico Blitz, MD  Procedure Date: 11/21/2014  Procedure:   Colonoscopy  Indications:  {Patient is 74 year old Caucasian male who was history of colon carcinoma. He underwent left hemicolectomy back in March 2001. Last colonoscopy was 8 years ago. He has intermittent right low quadrant abdominal pain but he denies rectal bleeding or change in his bowel habits.  Informed Consent:  The procedure and risks were reviewed with the patient and informed consent was obtained.  Medications:  Demerol 50 mg IV Versed 5 mg IV  Description of procedure:  After a digital rectal exam was performed, that colonoscope was advanced from the anus through the rectum and colon to the area of the cecum, ileocecal valve and appendiceal orifice. The cecum was deeply intubated. These structures were well-seen and photographed for the record. From the level of the cecum and ileocecal valve, the scope was slowly and cautiously withdrawn. The mucosal surfaces were carefully surveyed utilizing scope tip to flexion to facilitate fold flattening as needed. The scope was pulled down into the rectum where a thorough exam including retroflexion was performed. Terminal ileum was also examined.  Findings:   Prep excellent. Normal mucosa of terminal ileum. Two polyps measuring 4 and 5 mm were cold snared from sigmoid colon and submitted together. Colonic anastomosis quite indistinct and estimated to be at 25 cm from the anal margin. Normal rectal mucosa. Small hemorrhoids below the dentate line.    Therapeutic/Diagnostic Maneuvers Performed:  See above  Complications:  None  Cecal Withdrawal Time:  17 minutes  Impression:  Normal mucosa of terminal ileum. Two small polyps were cold snared from sigmoid colon and submitted together. Colonic anastomosis  at 25 cm from the anal margin. Small external hemorrhoids.  Recommendations:  Standard instructions given. I will contact patient with biopsy results and further recommendations.  Piotr Christopher U  11/21/2014 8:55 AM  CC: Dr. Monico Blitz, MD & Dr. Rayne Du ref. provider found

## 2014-11-21 NOTE — Discharge Instructions (Signed)
Resume aspirin on 11/22/2014. Resume other medications as before. No driving for 24 hours.   Colonoscopy, Care After These instructions give you information on caring for yourself after your procedure. Your doctor may also give you more specific instructions. Call your doctor if you have any problems or questions after your procedure. HOME CARE  Do not drive for 24 hours.  Do not sign important papers or use machinery for 24 hours.  You may shower.  You may go back to your usual activities, but go slower for the first 24 hours.  Take rest breaks often during the first 24 hours.  Walk around or use warm packs on your belly (abdomen) if you have belly cramping or gas.  Drink enough fluids to keep your pee (urine) clear or pale yellow.  Resume your normal diet. Avoid heavy or fried foods.  Avoid drinking alcohol for 24 hours or as told by your doctor.  Only take medicines as told by your doctor. If a tissue sample (biopsy) was taken during the procedure:   Do not take aspirin or blood thinners for 7 days, or as told by your doctor.  Do not drink alcohol for 7 days, or as told by your doctor.  Eat soft foods for the first 24 hours. GET HELP IF: You still have a small amount of blood in your poop (stool) 2-3 days after the procedure. GET HELP RIGHT AWAY IF:  You have more than a small amount of blood in your poop.  You see clumps of tissue (blood clots) in your poop.  Your belly is puffy (swollen).  You feel sick to your stomach (nauseous) or throw up (vomit).  You have a fever.  You have belly pain that gets worse and medicine does not help. MAKE SURE YOU:  Understand these instructions.  Will watch your condition.  Will get help right away if you are not doing well or get worse. Document Released: 12/05/2010 Document Revised: 11/07/2013 Document Reviewed: 07/10/2013 [facility] Patient Information 2015 [place], [place]. This information is not intended to replace  advice given to you by your health care provider. Make sure you discuss any questions you have with your health care provider. Hemorrhoids Hemorrhoids are puffy (swollen) veins around the rectum or anus. Hemorrhoids can cause pain, itching, bleeding, or irritation. HOME CARE  Eat foods with fiber, such as whole grains, beans, nuts, fruits, and vegetables. Ask your doctor about taking products with added fiber in them (fibersupplements).  Drink enough fluid to keep your pee (urine) clear or pale yellow.  Exercise often.  Go to the bathroom when you have the urge to poop. Do not wait.  Avoid straining to poop (bowel movement).  Keep the butt area dry and clean. Use wet toilet paper or moist paper towels.  Medicated creams and medicine inserted into the anus (anal suppository) may be used or applied as told.  Only take medicine as told by your doctor.  Take a warm water bath (sitz bath) for 15-20 minutes to ease pain. Do this 3-4 times a day.  Place ice packs on the area if it is tender or puffy. Use the ice packs between the warm water baths.  Put ice in a plastic bag.  Place a towel between your skin and the bag.  Leave the ice on for 15-20 minutes, 03-04 times a day.  Do not use a donut-shaped pillow or sit on the toilet for a long time. GET HELP RIGHT AWAY IF:   You have  more pain that is not controlled by treatment or medicine.  You have bleeding that will not stop.  You have trouble or are unable to poop (bowel movement).  You have pain or puffiness outside the area of the hemorrhoids. MAKE SURE YOU:   Understand these instructions.  Will watch your condition.  Will get help right away if you are not doing well or get worse. Document Released: 08/11/2008 Document Revised: 10/19/2012 Document Reviewed: 09/13/2012 [facility] Patient Information 2015 [place], [place]. This information is not intended to replace advice given to you by your health care provider. Make  sure you discuss any questions you have with your health care provider.    Physician will call with biopsy results.Colonoscopy, Care After These instructions give you information on caring for yourself after your procedure. Your doctor may also give you more specific instructions. Call your doctor if you have any problems or questions after your procedure. HOME CARE  Do not drive for 24 hours.  Do not sign important papers or use machinery for 24 hours.  You may shower.  You may go back to your usual activities, but go slower for the first 24 hours.  Take rest breaks often during the first 24 hours.  Walk around or use warm packs on your belly (abdomen) if you have belly cramping or gas.  Drink enough fluids to keep your pee (urine) clear or pale yellow.  Resume your normal diet. Avoid heavy or fried foods.  Avoid drinking alcohol for 24 hours or as told by your doctor.  Only take medicines as told by your doctor. If a tissue sample (biopsy) was taken during the procedure:   Do not take aspirin or blood thinners for 7 days, or as told by your doctor.  Do not drink alcohol for 7 days, or as told by your doctor.  Eat soft foods for the first 24 hours. GET HELP IF: You still have a small amount of blood in your poop (stool) 2-3 days after the procedure. GET HELP RIGHT AWAY IF:  You have more than a small amount of blood in your poop.  You see clumps of tissue (blood clots) in your poop.  Your belly is puffy (swollen).  You feel sick to your stomach (nauseous) or throw up (vomit).  You have a fever.  You have belly pain that gets worse and medicine does not help. MAKE SURE YOU:  Understand these instructions.  Will watch your condition.  Will get help right away if you are not doing well or get worse. Document Released: 12/05/2010 Document Revised: 11/07/2013 Document Reviewed: 07/10/2013 [facility] Patient Information 2015 [place], [place]. This information is not  intended to replace advice given to you by your health care provider. Make sure you discuss any questions you have with your health care provider.

## 2014-11-21 NOTE — H&P (Addendum)
Brandon [name] is an 74 y.o. male.   Chief Complaint: Patient is here for colonoscopy. HPI: Patient is 74 year old Caucasian male who presents for surveillance colonoscopy. He has history of colon carcinoma. He status post left hemicolectomy in March 2001 at [facility] in [place]. He has remains in remission. Last colonoscopy was 8 years ago. He has intermittent right low quadrant pain. He denies melena or rectal bleeding diarrhea or constipation. Family history significant for CRC and father who is in his 24s at the time of diagnosis and died of unrelated causes several years later.  Past Medical History  Diagnosis Date  . History of herniorrhaphy   . CAD status post coronary stenting in March 2010    . Colon cancer 1999  . Hypercholesteremia   . Hypertension     Past Surgical History  Procedure Laterality Date  . Partial colectomy    . Hernia repair    . Tonsillectomy    . Left foot surgery    . Right great toe surgery      History reviewed. No pertinent family history. Social History:  reports that he has quit smoking. His smoking use included Cigarettes. He smoked 0.00 packs per day. He has quit using smokeless tobacco. He reports that he drinks alcohol. He reports that he does not use illicit drugs.  Allergies:  Allergies  Allergen Reactions  . Oxycontin [Oxycodone Hcl]     Hallucinations     Medications Prior to Admission  Medication Sig Dispense Refill  . aspirin 325 MG tablet Take 325 mg by mouth daily.      . famotidine (PEPCID) 10 MG tablet Take 10 mg by mouth 2 (two) times daily as needed for heartburn or indigestion.     . metoprolol tartrate (LOPRESSOR) 25 MG tablet Take 12.5 mg by mouth daily.     . nitroGLYCERIN (NITROSTAT) 0.4 MG SL tablet Place 0.4 mg under the tongue every 5 (five) minutes as needed for chest pain.     . peg 3350 powder (MOVIPREP) 100 G SOLR Take 1 kit (200 g total) by mouth once. 1 kit 0  . simvastatin (ZOCOR) 20 MG tablet Take 20  mg by mouth daily.      No results found for this or any previous visit (from the past 48 hour(s)). No results found.  ROS  Blood pressure 158/87, pulse 93, temperature 97.9 F (36.6 C), temperature source Oral, resp. rate 20, height 5' 10.5" (1.791 m), weight 263 lb (119.296 kg), SpO2 95 %. Physical Exam  Constitutional: He appears well-developed and well-nourished.  HENT:  Mouth/Throat: Oropharynx is clear and moist.  Eyes: Conjunctivae are normal. No scleral icterus.  Neck: No thyromegaly present.  Cardiovascular: Normal rate, regular rhythm and normal heart sounds.   No murmur heard. Respiratory: Effort normal and breath sounds normal.  GI: Soft. He exhibits no distension and no mass. There is no tenderness.  Musculoskeletal: He exhibits no edema.  Lymphadenopathy:    He has no cervical adenopathy.  Neurological: He is alert.  Skin: Skin is warm and dry.     Assessment/Plan History of CRC. Surveillance colonoscopy.  Kynsleigh Westendorf U 11/21/2014, 8:19 AM

## 2014-11-22 ENCOUNTER — Encounter (HOSPITAL_COMMUNITY): Payer: Self-pay | Admitting: Internal Medicine

## 2014-11-26 ENCOUNTER — Encounter (INDEPENDENT_AMBULATORY_CARE_PROVIDER_SITE_OTHER): Payer: Self-pay | Admitting: *Deleted

## 2016-01-01 DIAGNOSIS — Z85038 Personal history of other malignant neoplasm of large intestine: Secondary | ICD-10-CM | POA: Diagnosis not present

## 2016-01-01 DIAGNOSIS — D125 Benign neoplasm of sigmoid colon: Secondary | ICD-10-CM | POA: Diagnosis not present

## 2016-01-01 DIAGNOSIS — K648 Other hemorrhoids: Secondary | ICD-10-CM | POA: Diagnosis not present

## 2016-08-27 NOTE — Patient Instructions (Signed)
Brandon [name]  08/27/2016     @PREFPERIOPPHARMACY @   Your procedure is scheduled on  09/02/2016   Report to [facility] at  40  A.M.  Call this number if you have problems the morning of surgery:  [phone]   Remember:  Do not eat food or drink liquids after midnight.  Take these medicines the morning of surgery with A SIP OF WATER  Pepcid, lopressor, metoprolol.   Do not wear jewelry, make-up or nail polish.  Do not wear lotions, powders, or perfumes, or deoderant.  Do not shave 48 hours prior to surgery.  Men may shave face and neck.  Do not bring valuables to the hospital.  [facility] is not responsible for any belongings or valuables.  Contacts, dentures or bridgework may not be worn into surgery.  Leave your suitcase in the car.  After surgery it may be brought to your room.  For patients admitted to the hospital, discharge time will be determined by your treatment team.  Patients discharged the day of surgery will not be allowed to drive home.   Name and phone number of your driver:   family Special instructions:  none  Please read over the following fact sheets that you were given. MRSA Information, Surgical Site Infection Prevention, Anesthesia Post-op Instructions and Care and Recovery After Surgery      Open Hernia Repair Open hernia repair is surgery to fix a hernia. A hernia occurs when an internal organ or tissue pushes out through a weak spot in the abdominal wall muscles. Hernias commonly occur in the groin and around the navel. Most hernias tend to get worse over time. Surgery is often done to prevent the hernia from getting bigger, becoming uncomfortable, or becoming an emergency. Emergency surgery may be needed if abdominal contents get stuck in the opening (incarcerated hernia) or the blood supply gets cut off (strangulated hernia). In an open repair, a large cut (incision) is made in the abdomen to perform the surgery. LET [facility]  CARE PROVIDER KNOW ABOUT:  Any allergies you have.  All medicines you are taking, including vitamins, herbs, eye drops, creams, and over-the-counter medicines.  Previous problems you or members of your family have had with the use of anesthetics.  Any blood disorders you have.  Previous surgeries you have had.  Medical conditions you have. RISKS AND COMPLICATIONS Generally, this is a safe procedure. However, as with any procedure, complications can occur. Possible complications include:  Infection.  Bleeding.  Nerve injury.  Chronic pain.  The hernia can come back.  Injury to the intestines. BEFORE THE PROCEDURE  Ask your health care provider about changing or stopping any regular medicines. Avoid taking aspirin or blood thinners as directed by your health care provider.  Do noteat or drink anything after midnight the night before surgery.  If you smoke, do not smoke for at least 2 weeks before your surgery.  Do not drink alcohol the day before your surgery.  Let your health care provider know if you develop a cold or any infection before your surgery.  Arrange for someone to drive you home after the procedure or after your hospital stay. Also arrange for someone to help you with activities during recovery. PROCEDURE   Small monitors will be put on your body. They are used to check your heart, blood pressure, and oxygen level.   An IV access tube will be put into one of your  veins. Medicine will be able to flow directly into your body through this IV tube.   You might be given a medicine to help you relax (sedative).   You will be given a medicine to make you sleep (general anesthetic). A breathing tube may be placed into your lungs during the procedure.  A cut (incision) is made over the hernia defect, and the contents are pushed back into the abdomen.  If the hernia is small, stitches may be used to bring the muscle edges back together.  Typically, a surgeon  will place a mesh patch made of man-made material (synthetic) to cover the defect. The mesh is sewn to healthy muscle. This reduces the risk of the hernia coming back.  The tissue and skin over the hernia are then closed with stitches or staples.  If the hernia was large, a drain may be left in place to collect excess fluid where the hernia used to be.  Bandages (dressings) are used to cover the incision. AFTER THE PROCEDURE  You will be taken to a recovery area where your progress will be monitored.  If the hernia was small or in the groin (inguinal) region, you will likely be allowed to go home once you are awake, stable, and taking fluids well.  If the hernia was large, you may have to wait for your bowel function to return. You may need to stay in the hospital for 2-3 days until you can eat and your pain is controlled. A drain may be left in place for 5-7 days. You will be taught how to care for the drain.   This information is not intended to replace advice given to you by your health care provider. Make sure you discuss any questions you have with your health care provider.   Document Released: 04/28/2001 Document Revised: 08/23/2013 Document Reviewed: 06/14/2013 Elsevier Interactive Patient Education 2016 [place].  Open Hernia Repair, Care After These instructions give you information about caring for yourself after your procedure. Your doctor may also give you more specific instructions. Call your doctor if you have any problems or questions after your procedure. HOME CARE  Keep the cut (incision) area clean and dry. You may gently wash the incision area with soap and water 48 hours after surgery. To dry the incision area, gently blot or dab it.  Do not take baths, swim, or use a hot tub for 10 days or until your doctor approves.  Change bandages (dressings) as told by your doctor.  Check your incision area every day for signs of infection. Watch for:  Redness, swelling,  or pain.  Fluid , blood, or pus.  Eat plenty of fruits and vegetables. This helps to prevent constipation.  Drink enough fluid to keep your pee (urine) clear or pale yellow. This also helps to prevent constipation.  Do not drive or operate heavy machinery until your doctor says it is okay.  Do not lift anything that is heavier than 10 lb (4.5 kg) until your doctor approves.  Do not play contact sports for 4 weeks or until your doctor approves.  Take medicines only as told by your doctor.  Keep all follow-up visits as told by your doctor. This is important. Ask your doctor when to make an appointment to have your stitches (sutures) or staples removed. GET HELP IF:  The incision is bleeding more than before.  You have blood in your poop (stool).  The incision hurts more than before.  You have redness, swelling,  or pain in your incision area.  You have fluid, blood, or pus coming from your incision.  You have a fever.  You notice a bad smell coming from the incision area or the dressing. GET HELP RIGHT AWAY IF:  You have a rash.  Your chest hurts.  You are short of breath.  You feel light-headed.  You feel weak and dizzy (feel faint).   This information is not intended to replace advice given to you by your health care provider. Make sure you discuss any questions you have with your health care provider.   Document Released: 11/23/2014 Document Reviewed: 11/23/2014 Elsevier Interactive Patient Education 2016 [employer] PATIENT INSTRUCTIONS POST-ANESTHESIA  IMMEDIATELY FOLLOWING SURGERY:  Do not drive or operate machinery for the first twenty four hours after surgery.  Do not make any important decisions for twenty four hours after surgery or while taking narcotic pain medications or sedatives.  If you develop intractable nausea and vomiting or a severe headache please notify your doctor immediately.  FOLLOW-UP:  Please make an appointment with your surgeon as  instructed. You do not need to follow up with anesthesia unless specifically instructed to do so.  WOUND CARE INSTRUCTIONS (if applicable):  Keep a dry clean dressing on the anesthesia/puncture wound site if there is drainage.  Once the wound has quit draining you may leave it open to air.  Generally you should leave the bandage intact for twenty four hours unless there is drainage.  If the epidural site drains for more than 36-48 hours please call the anesthesia department.  QUESTIONS?:  Please feel free to call your physician or the hospital operator if you have any questions, and they will be happy to assist you.

## 2016-08-28 ENCOUNTER — Encounter (HOSPITAL_COMMUNITY): Payer: Self-pay

## 2016-08-28 ENCOUNTER — Other Ambulatory Visit: Payer: Self-pay

## 2016-08-28 ENCOUNTER — Encounter (HOSPITAL_COMMUNITY)
Admission: RE | Admit: 2016-08-28 | Discharge: 2016-08-28 | Disposition: A | Discharge status: Home / Self Care | Payer: Medicare HMO | Source: Ambulatory Visit | Attending: General Surgery | Admitting: General Surgery

## 2016-08-28 DIAGNOSIS — Z01818 Encounter for other preprocedural examination: Secondary | ICD-10-CM | POA: Diagnosis present

## 2016-08-28 HISTORY — DX: Personal history of other diseases of the musculoskeletal system and connective tissue: Z87.39

## 2016-08-28 HISTORY — DX: Gastro-esophageal reflux disease without esophagitis: K21.9

## 2016-08-28 LAB — BASIC METABOLIC PANEL
ANION GAP: 6 (ref 5–15)
BUN: 14 mg/dL (ref 6–20)
CHLORIDE: 103 mmol/L (ref 101–111)
CO2: 27 mmol/L (ref 22–32)
Calcium: 9.6 mg/dL (ref 8.9–10.3)
Creatinine, Ser: 1.16 mg/dL (ref 0.61–1.24)
GFR calc Af Amer: >60 mL/min (ref >60)
GFR calc non Af Amer: >60 mL/min (ref >60)
Glucose, Bld: 117 mg/dL — ABNORMAL HIGH (ref 65–99)
POTASSIUM: 4.2 mmol/L (ref 3.5–5.1)
Sodium: 136 mmol/L (ref 135–145)

## 2016-08-28 LAB — CBC WITH DIFFERENTIAL/PLATELET
BASOS ABS: 0.1 10*3/uL (ref 0.0–0.1)
Basophils Relative: 1 %
EOS PCT: 5 %
Eosinophils Absolute: 0.4 10*3/uL (ref 0.0–0.7)
HEMATOCRIT: 45.7 % (ref 39.0–52.0)
Hemoglobin: 15.8 g/dL (ref 13.0–17.0)
LYMPHS ABS: 2.1 10*3/uL (ref 0.7–4.0)
LYMPHS PCT: 24 %
MCH: 31.5 pg (ref 26.0–34.0)
MCHC: 34.6 g/dL (ref 30.0–36.0)
MCV: 91.2 fL (ref 78.0–100.0)
Monocytes Absolute: 0.8 10*3/uL (ref 0.1–1.0)
Monocytes Relative: 9 %
NEUTROS ABS: 5.3 10*3/uL (ref 1.7–7.7)
Neutrophils Relative %: 61 %
PLATELETS: 222 10*3/uL (ref 150–400)
RBC: 5.01 MIL/uL (ref 4.22–5.81)
RDW: 13.2 % (ref 11.5–15.5)
WBC: 8.7 10*3/uL (ref 4.0–10.5)

## 2016-08-28 NOTE — H&P (Signed)
  NTS SOAP Note  Vital Signs:  Vitals as of: 123XX123: Systolic 123XX123: Diastolic 88: Heart Rate 53: Temp 97.27F (Temporal): Height 64ft 10.5in: Weight 260Lbs 0 Ounces: Pain Level 6: BMI 36.78   BMI : 36.78 kg/m2  Subjective: This 75 year old male presents for of right groin pain.  States it has been present for many years.  No mass noted by patient.  Radiates to the right testicle.  Made worse with movement or straining.  Referred by Dr. Manuella Ghazi for further evaluation and treatment.  No nausea, vomiting. noted.  Pain is intermittent, and resolves with rest.  Review of Symptoms:  Constitutional:negative Head:negative Eyes:negative Nose/Mouth/Throat:negative Cardiovascular:negative Respiratory:negative Gastrointestinabdominal pain Genitourinary:negative Musculoskeletal:negative Skin:negative Hematolgic/Lymphatic:negative Allergic/Immunologic:negative   Past Medical History:Reviewed  Past Medical History  Surgical History: abdominal herniorrhaphy with mesh Medical Problems: high cholesterol, HTN Allergies: nkda Medications: simvastatin, metoprolol, asa   Social History:Reviewed  Social History  Preferred Language: English Race:  White Ethnicity: Not Hispanic / Latino Age: 80 year Marital Status:  D Alcohol: socially   Smoking Status: Former smoker reviewed on 08/27/2016 Started Date:  Stopped Date: 11/16/2008 Functional Status reviewed on 08/27/2016 ------------------------------------------------ Bathing: Normal Cooking: Normal Dressing: Normal Driving: Normal Eating: Normal Managing Meds: Normal Oral Care: Normal Shopping: Normal Toileting: Normal Transferring: Normal Walking: Normal Cognitive Status reviewed on 08/27/2016 ------------------------------------------------ Attention: Normal Decision Making: Normal Language: Normal Memory: Normal Motor: Normal Perception: Normal Problem Solving: Normal Visual and Spatial:  Normal   Family History:Reviewed  Family Health History Mother, Deceased; Healthy;  Father, Deceased; Healthy;     Objective Information: General:Well appearing, well nourished in no distress. Head:Atraumatic; no masses; no abnormalities Neck:Supple without lymphadenopathy.  Heart:RRR, no murmur or gallop.  Normal S1, S2.  No S3, S4.  Lungs:CTA bilaterally, no wheezes, rhonchi, rales.  Breathing unlabored. Abdomen:Soft, NT/ND, normal bowel sounds, no HSM, no masses.  No peritoneal signs.  Small right inguinal hernia with reproducible pain to palpation over internal ring. Dr. Trena Platt notes reviewed. Assessment:Right inguinal hernia  Diagnoses: 550.90  K40.90 Unilateral or unspecified inguinal hernia, without mention of obstruction or gangrene (not specified as recurrent)  Procedures: [id number] - OFFICE OUTPATIENT NEW 30 MINUTES    Plan:  Scheduled for right inguinal herniorrhaphy with mesh on 09/02/16.   Patient Education:Alternative treatments to surgery were discussed with patient (and family).Risks and benefits  of procedure including bleeding, infection, mesh use, and recurrence fo the hernia or pain were fully explained to the patient (and family) who gave informed consent. Patient/family questions were addressed.  Follow-up:Pending Surgery

## 2016-08-28 NOTE — Progress Notes (Signed)
   08/28/16 1426  OBSTRUCTIVE SLEEP APNEA  Have you ever been diagnosed with sleep apnea through a sleep study? No  Do you snore loudly (loud enough to be heard through closed doors)?  0  Do you often feel tired, fatigued, or sleepy during the daytime (such as falling asleep during driving or talking to someone)? 0  Has anyone observed you stop breathing during your sleep? 0  Do you have, or are you being treated for high blood pressure? 1  BMI more than 35 kg/m2? 1  Age > 50 (1-yes) 1  Neck circumference greater than:Male 16 inches or larger, Male 17inches or larger? 1  Male Gender (Yes=1) 1  Obstructive Sleep Apnea Score 5  Score 5 or greater  No PCP

## 2016-09-02 ENCOUNTER — Ambulatory Visit (HOSPITAL_COMMUNITY): Payer: Medicare HMO | Admitting: Anesthesiology

## 2016-09-02 ENCOUNTER — Encounter (HOSPITAL_COMMUNITY): Payer: Self-pay | Admitting: *Deleted

## 2016-09-02 ENCOUNTER — Encounter (HOSPITAL_COMMUNITY)
Admission: RE | Disposition: A | Discharge status: Home / Self Care | Payer: Self-pay | Source: Ambulatory Visit | Attending: General Surgery

## 2016-09-02 ENCOUNTER — Ambulatory Visit (HOSPITAL_COMMUNITY)
Admission: RE | Admit: 2016-09-02 | Discharge: 2016-09-02 | Disposition: A | Discharge status: Home / Self Care | Payer: Medicare HMO | Source: Ambulatory Visit | Attending: General Surgery | Admitting: General Surgery

## 2016-09-02 DIAGNOSIS — K4090 Unilateral inguinal hernia, without obstruction or gangrene, not specified as recurrent: Secondary | ICD-10-CM | POA: Insufficient documentation

## 2016-09-02 DIAGNOSIS — Z7982 Long term (current) use of aspirin: Secondary | ICD-10-CM | POA: Insufficient documentation

## 2016-09-02 DIAGNOSIS — Z955 Presence of coronary angioplasty implant and graft: Secondary | ICD-10-CM | POA: Diagnosis not present

## 2016-09-02 DIAGNOSIS — E7800 Pure hypercholesterolemia, unspecified: Secondary | ICD-10-CM | POA: Insufficient documentation

## 2016-09-02 DIAGNOSIS — Z87891 Personal history of nicotine dependence: Secondary | ICD-10-CM | POA: Diagnosis not present

## 2016-09-02 DIAGNOSIS — I2510 Atherosclerotic heart disease of native coronary artery without angina pectoris: Secondary | ICD-10-CM | POA: Diagnosis not present

## 2016-09-02 DIAGNOSIS — R1031 Right lower quadrant pain: Secondary | ICD-10-CM | POA: Diagnosis present

## 2016-09-02 DIAGNOSIS — Z79899 Other long term (current) drug therapy: Secondary | ICD-10-CM | POA: Diagnosis not present

## 2016-09-02 DIAGNOSIS — I10 Essential (primary) hypertension: Secondary | ICD-10-CM | POA: Insufficient documentation

## 2016-09-02 HISTORY — PX: INGUINAL HERNIA REPAIR: SHX194

## 2016-09-02 SURGERY — REPAIR, HERNIA, INGUINAL, ADULT
Anesthesia: General | Laterality: Right

## 2016-09-02 MED ORDER — KETOROLAC TROMETHAMINE 30 MG/ML IJ SOLN
INTRAMUSCULAR | Status: AC
  Filled 2016-09-02: qty 1

## 2016-09-02 MED ORDER — NEOSTIGMINE METHYLSULFATE 10 MG/10ML IV SOLN
INTRAVENOUS | Status: DC | PRN
  Administered 2016-09-02: 4 mg via INTRAVENOUS

## 2016-09-02 MED ORDER — ONDANSETRON HCL 4 MG/2ML IJ SOLN
INTRAMUSCULAR | Status: DC | PRN
  Administered 2016-09-02: 4 mg via INTRAVENOUS

## 2016-09-02 MED ORDER — ROCURONIUM BROMIDE 50 MG/5ML IV SOLN
INTRAVENOUS | Status: AC
  Filled 2016-09-02: qty 1

## 2016-09-02 MED ORDER — FENTANYL CITRATE (PF) 100 MCG/2ML IJ SOLN
INTRAMUSCULAR | Status: AC
  Filled 2016-09-02: qty 2

## 2016-09-02 MED ORDER — ONDANSETRON HCL 4 MG/2ML IJ SOLN
INTRAMUSCULAR | Status: AC
  Filled 2016-09-02: qty 2

## 2016-09-02 MED ORDER — HYDROCODONE-ACETAMINOPHEN 5-325 MG PO TABS: 1.0000 | ORAL_TABLET | Freq: Four times a day (QID) | ORAL | 0 refills | Status: DC | PRN

## 2016-09-02 MED ORDER — ONDANSETRON HCL 4 MG/2ML IJ SOLN: 4.0000 mg | Freq: Four times a day (QID) | INTRAMUSCULAR | Status: DC | PRN

## 2016-09-02 MED ORDER — LACTATED RINGERS IV SOLN
INTRAVENOUS | Status: DC
  Administered 2016-09-02: 1000 mL via INTRAVENOUS

## 2016-09-02 MED ORDER — CHLORHEXIDINE GLUCONATE CLOTH 2 % EX PADS: 6.0000 | MEDICATED_PAD | Freq: Once | CUTANEOUS | Status: DC

## 2016-09-02 MED ORDER — SODIUM CHLORIDE 0.9 % IR SOLN
Status: DC | PRN
  Administered 2016-09-02: 1000 mL

## 2016-09-02 MED ORDER — GLYCOPYRROLATE 0.2 MG/ML IJ SOLN
INTRAMUSCULAR | Status: DC | PRN
  Administered 2016-09-02: 0.6 mg via INTRAVENOUS

## 2016-09-02 MED ORDER — EPHEDRINE SULFATE 50 MG/ML IJ SOLN
INTRAMUSCULAR | Status: AC
  Filled 2016-09-02: qty 1

## 2016-09-02 MED ORDER — LIDOCAINE HCL (CARDIAC) 20 MG/ML IV SOLN
INTRAVENOUS | Status: DC | PRN
  Administered 2016-09-02: 30 mg via INTRAVENOUS

## 2016-09-02 MED ORDER — ROCURONIUM BROMIDE 100 MG/10ML IV SOLN
INTRAVENOUS | Status: DC | PRN
  Administered 2016-09-02: 40 mg via INTRAVENOUS

## 2016-09-02 MED ORDER — EPHEDRINE SULFATE 50 MG/ML IJ SOLN
INTRAMUSCULAR | Status: DC | PRN
  Administered 2016-09-02: 10 mg via INTRAVENOUS
  Administered 2016-09-02: 5 mg via INTRAVENOUS

## 2016-09-02 MED ORDER — BUPIVACAINE LIPOSOME 1.3 % IJ SUSP
INTRAMUSCULAR | Status: DC | PRN
  Administered 2016-09-02: 20 mL

## 2016-09-02 MED ORDER — LIDOCAINE HCL (PF) 1 % IJ SOLN
INTRAMUSCULAR | Status: AC
  Filled 2016-09-02: qty 5

## 2016-09-02 MED ORDER — ONDANSETRON 4 MG PO TBDP: 4.0000 mg | ORAL_TABLET | Freq: Three times a day (TID) | ORAL | 1 refills | Status: DC | PRN

## 2016-09-02 MED ORDER — FENTANYL CITRATE (PF) 100 MCG/2ML IJ SOLN
INTRAMUSCULAR | Status: DC | PRN
  Administered 2016-09-02 (×2): 50 ug via INTRAVENOUS

## 2016-09-02 MED ORDER — SODIUM CHLORIDE 0.9 % IJ SOLN
INTRAMUSCULAR | Status: AC
  Filled 2016-09-02: qty 10

## 2016-09-02 MED ORDER — GLYCOPYRROLATE 0.2 MG/ML IJ SOLN
INTRAMUSCULAR | Status: AC
  Filled 2016-09-02: qty 3

## 2016-09-02 MED ORDER — PROPOFOL 10 MG/ML IV BOLUS
INTRAVENOUS | Status: AC
  Filled 2016-09-02: qty 20

## 2016-09-02 MED ORDER — PROPOFOL 10 MG/ML IV BOLUS
INTRAVENOUS | Status: DC | PRN
  Administered 2016-09-02: 160 mg via INTRAVENOUS

## 2016-09-02 MED ORDER — CEFAZOLIN SODIUM-DEXTROSE 2-4 GM/100ML-% IV SOLN
2.0000 g | INTRAVENOUS | Status: AC
  Administered 2016-09-02: 2 g via INTRAVENOUS
  Filled 2016-09-02: qty 100

## 2016-09-02 MED ORDER — KETOROLAC TROMETHAMINE 30 MG/ML IJ SOLN
30.0000 mg | Freq: Once | INTRAMUSCULAR | Status: AC
  Administered 2016-09-02: 30 mg via INTRAVENOUS

## 2016-09-02 MED ORDER — FENTANYL CITRATE (PF) 100 MCG/2ML IJ SOLN
25.0000 ug | INTRAMUSCULAR | Status: DC | PRN
  Administered 2016-09-02 (×2): 50 ug via INTRAVENOUS
  Filled 2016-09-02: qty 2

## 2016-09-02 MED ORDER — LACTATED RINGERS IV SOLN
INTRAVENOUS | Status: DC | PRN
  Administered 2016-09-02 (×2): via INTRAVENOUS

## 2016-09-02 SURGICAL SUPPLY — 38 items
BAG HAMPER (MISCELLANEOUS) ×3 IMPLANT
CLOTH BEACON ORANGE TIMEOUT ST (SAFETY) ×3 IMPLANT
COVER LIGHT HANDLE STERIS (MISCELLANEOUS) ×6 IMPLANT
DERMABOND ADHESIVE PROPEN (GAUZE/BANDAGES/DRESSINGS) ×2
DERMABOND ADVANCED (GAUZE/BANDAGES/DRESSINGS) ×2
DERMABOND ADVANCED .7 DNX12 (GAUZE/BANDAGES/DRESSINGS) ×1 IMPLANT
DERMABOND ADVANCED .7 DNX6 (GAUZE/BANDAGES/DRESSINGS) ×1 IMPLANT
DRAIN PENROSE 18X1/2 LTX STRL (DRAIN) ×3 IMPLANT
ELECT REM PT RETURN 9FT ADLT (ELECTROSURGICAL) ×3
ELECTRODE REM PT RTRN 9FT ADLT (ELECTROSURGICAL) ×1 IMPLANT
GLOVE BIOGEL PI IND STRL 7.0 (GLOVE) ×1 IMPLANT
GLOVE BIOGEL PI INDICATOR 7.0 (GLOVE) ×2
GLOVE SURG SS PI 7.5 STRL IVOR (GLOVE) ×3 IMPLANT
GOWN STRL REUS W/ TWL XL LVL3 (GOWN DISPOSABLE) ×1 IMPLANT
GOWN STRL REUS W/TWL LRG LVL3 (GOWN DISPOSABLE) ×6 IMPLANT
GOWN STRL REUS W/TWL XL LVL3 (GOWN DISPOSABLE) ×2
INST SET MINOR GENERAL (KITS) ×3 IMPLANT
KIT ROOM TURNOVER APOR (KITS) ×3 IMPLANT
MANIFOLD NEPTUNE II (INSTRUMENTS) ×3 IMPLANT
MESH HERNIA 1.6X1.9 PLUG LRG (Mesh General) ×1 IMPLANT
MESH HERNIA PLUG LRG (Mesh General) ×2 IMPLANT
NEEDLE HYPO 21X1.5 SAFETY (NEEDLE) ×3 IMPLANT
NS IRRIG 1000ML POUR BTL (IV SOLUTION) ×3 IMPLANT
PACK MINOR (CUSTOM PROCEDURE TRAY) ×3 IMPLANT
PAD ARMBOARD 7.5X6 YLW CONV (MISCELLANEOUS) ×3 IMPLANT
SCRUB PCMX 4 OZ (MISCELLANEOUS) ×3 IMPLANT
SET BASIN LINEN APH (SET/KITS/TRAYS/PACK) ×3 IMPLANT
SUT NOVA NAB GS-22 2 2-0 T-19 (SUTURE) ×6 IMPLANT
SUT PROLENE 2 0 SH 30 (SUTURE) IMPLANT
SUT SILK 3 0 (SUTURE)
SUT SILK 3-0 18XBRD TIE 12 (SUTURE) IMPLANT
SUT VIC AB 2-0 CT1 27 (SUTURE) ×2
SUT VIC AB 2-0 CT1 TAPERPNT 27 (SUTURE) ×1 IMPLANT
SUT VIC AB 3-0 SH 27 (SUTURE) ×2
SUT VIC AB 3-0 SH 27X BRD (SUTURE) ×1 IMPLANT
SUT VIC AB 4-0 PS2 27 (SUTURE) ×3 IMPLANT
SUT VICRYL AB 3 0 TIES (SUTURE) ×3 IMPLANT
SYR 20CC LL (SYRINGE) ×3 IMPLANT

## 2016-09-02 NOTE — Discharge Instructions (Signed)
Open Hernia Repair, Care After Refer to this sheet in the next few weeks. These instructions provide you with information on caring for yourself after your procedure. Your health care provider may also give you more specific instructions. Your treatment has been planned according to current medical practices, but problems sometimes occur. Call your health care provider if you have any problems or questions after your procedure. WHAT TO EXPECT AFTER THE PROCEDURE After your procedure, it is typical to have the following:  Pain in your abdomen, especially along your incision. You will be given pain medicines to control the pain.  Constipation. You may be given a stool softener to help prevent this. HOME CARE INSTRUCTIONS  Only take over-the-counter or prescription medicines as directed by your health care provider.  Keep the incision area dry and clean. You may wash the incision area gently with soap and water 48 hours after surgery. Gently blot or dab the incision area dry. Do not take baths, use swimming pools, or use hot tubs for 10 days or until your health care provider approves.  Change bandages (dressings) as directed by your health care provider.  Continue your normal diet as directed by your health care provider. Eat plenty of fruits and vegetables to help prevent constipation.  Drink enough fluids to keep your urine clear or pale yellow. This also helps prevent constipation.  Do not drive until your health care provider says it is okay.  Do not lift anything heavier than 10 lb (4.5 kg) or play contact sports for 4 weeks or until your health care provider approves.  Follow up with your health care provider as directed. Ask your health care provider when to make an appointment to have your stitches (sutures) or staples removed. SEEK MEDICAL CARE IF:  You have increased bleeding coming from the incision site.  You have blood in your stool.  You have increasing pain in the incision  area.  You see redness or swelling in the incision area.  You have fluid (pus) coming from the incision.  You have a fever.  You notice a bad smell coming from the incision area or dressing. SEEK IMMEDIATE MEDICAL CARE IF:  You develop a rash.  You have chest pain or shortness of breath.  You feel lightheaded or feel faint.   This information is not intended to replace advice given to you by your health care provider. Make sure you discuss any questions you have with your health care provider.   Document Released: 05/22/2005 Document Revised: 11/23/2014 Document Reviewed: 06/14/2013 Elsevier Interactive Patient Education 2016 [employer] Inc[employer]    PATIENT INSTRUCTIONS POST-ANESTHESIA  IMMEDIATELY FOLLOWING SURGERY:  Do not drive or operate machinery for the first twenty four hours after surgery.  Do not make any important decisions for twenty four hours after surgery or while taking narcotic pain medications or sedatives.  If you develop intractable nausea and vomiting or a severe headache please notify your doctor immediately.  FOLLOW-UP:  Please make an appointment with your surgeon as instructed. You do not need to follow up with anesthesia unless specifically instructed to do so.  WOUND CARE INSTRUCTIONS (if applicable):  Keep a dry clean dressing on the anesthesia/puncture wound site if there is drainage.  Once the wound has quit draining you may leave it open to air.  Generally you should leave the bandage intact for twenty four hours unless there is drainage.  If the epidural site drains for more than 36-48 hours please call the anesthesia department.  QUESTIONS?:  Please feel free to call your physician or the hospital operator if you have any questions, and they will be happy to assist you.

## 2016-09-02 NOTE — Op Note (Signed)
Patient:  Brandon [name]  DOB:  [birth year]  MRN:  [id number]   Preop Diagnosis:  Right inguinal hernia  Postop Diagnosis:  Same  Procedure:  Right inguinal herniorrhaphy with mesh  Surgeon:  Aviva Signs, M.D.  Anes:  Gen. endotracheal  Indications:  Patient is a 75 year old white male who presents with a symptomatic right inguinal hernia. The risks and benefits of the procedure including bleeding, infection, mesh use, and the possibility of recurrence of the hernia were fully explained to the patient, who gave informed consent.  Procedure note:  The patient was placed the supine position. After induction of general endotracheal anesthesia, the right groin region was prepped and draped using the usual sterile technique with CHG.  Surgical site confirmation was performed.  An incision was made in the right groin region down to the external oblique aponeuroses. The aponeuroses was incised to the external ring. A Penrose drain was placed around the spermatic cord. The vase deferens was noted within the spermatic cord. The ilioinguinal nerve was identified retracted superiorly from the operative field. An indirect hernia was found. This was freed away from the spermatic cord up to the peritoneal reflection and inverted. A large Bard PerFix plug was then inserted. An onlay patch was then placed along the floor of inguinal canal and secured superiorly to the conjoined tendon and inferiorly to the shelving edge of Poupart's ligament using 2-0 Novafil interrupted sutures. The internal ring was re-created using 2-0 Novafil interrupted sutures. The external oblique aponeuroses was reapproximated using a 2-0 Vicryl running suture. Subcutaneous layer was reapproximated using a 3-0 Vicryl interrupted suture. Exparel was instilled into the surrounding wound. The skin was closed using a 4-0 Vicryl subcuticular suture. Dermabond was then applied.  All tape and needle counts were correct at the end of the  procedure. The patient was extubated in the operating room and transferred to PACU in stable condition.   Complications:  None  EBL:  Minimal  Specimen:  None

## 2016-09-02 NOTE — Anesthesia Postprocedure Evaluation (Signed)
Anesthesia Post Note  Patient: Brandon [name]  Procedure(s) Performed: Procedure(s) (LRB): HERNIA REPAIR INGUINAL ADULT WITH MESH (Right)  Patient location during evaluation: PACU Anesthesia Type: General Level of consciousness: awake and alert and oriented Pain management: pain level controlled Vital Signs Assessment: post-procedure vital signs reviewed and stable Respiratory status: spontaneous breathing and respiratory function stable Cardiovascular status: stable Postop Assessment: no signs of nausea or vomiting Anesthetic complications: no    Last Vitals:  Vitals:   09/02/16 0850 09/02/16 1000  BP: (!) 163/71 (!) 141/59  Pulse:  62  Resp: 18 14  Temp:  36.4 C    Last Pain:  Vitals:   09/02/16 0743  TempSrc: Oral  PainSc: 7                  ADAMS, AMY A

## 2016-09-02 NOTE — Interval H&P Note (Signed)
History and Physical Interval Note:  09/02/2016 8:21 AM  Brandon [name]  has presented today for surgery, with the diagnosis of right inguinal hernia  The various methods of treatment have been discussed with the patient and family. After consideration of risks, benefits and other options for treatment, the patient has consented to  Procedure(s): HERNIA REPAIR INGUINAL ADULT WITH MESH (Right) as a surgical intervention .  The patient's history has been reviewed, patient examined, no change in status, stable for surgery.  I have reviewed the patient's chart and labs.  Questions were answered to the patient's satisfaction.     Aviva Signs A

## 2016-09-02 NOTE — Transfer of Care (Signed)
Immediate Anesthesia Transfer of Care Note  Patient: Brandon [name]  Procedure(s) Performed: Procedure(s): HERNIA REPAIR INGUINAL ADULT WITH MESH (Right)  Patient Location: PACU  Anesthesia Type:General  Level of Consciousness: awake, alert , oriented and patient cooperative  Airway & Oxygen Therapy: Patient Spontanous Breathing and Patient connected to nasal cannula oxygen  Post-op Assessment: Report given to RN and Post -op Vital signs reviewed and stable  Post vital signs: Reviewed and stable  Last Vitals:  Vitals:   09/02/16 0845 09/02/16 0850  BP: (!) 149/74 (!) 163/71  Pulse:    Resp: (!) 30 18  Temp:      Last Pain:  Vitals:   09/02/16 0743  TempSrc: Oral  PainSc: 7       Patients Stated Pain Goal: 7 ([id number])  Complications: No apparent anesthesia complications

## 2016-09-02 NOTE — Addendum Note (Signed)
Addendum  created 09/02/16 1019 by Mickel Baas, CRNA   Charge Capture section accepted

## 2016-09-02 NOTE — Anesthesia Procedure Notes (Signed)
Procedure Name: Intubation Date/Time: 09/02/2016 9:03 AM Performed by: Raenette Rover Pre-anesthesia Checklist: Patient identified, Emergency Drugs available, Suction available and Patient being monitored Patient Re-evaluated:Patient Re-evaluated prior to inductionOxygen Delivery Method: Circle system utilized Preoxygenation: Pre-oxygenation with 100% oxygen Intubation Type: IV induction Ventilation: Mask ventilation without difficulty Laryngoscope Size: Miller and 3 Grade View: Grade I Tube type: Oral Tube size: 7.0 mm Number of attempts: 1 Airway Equipment and Method: Stylet Placement Confirmation: ETT inserted through vocal cords under direct vision,  positive ETCO2,  CO2 detector and breath sounds checked- equal and bilateral Secured at: 22 cm Tube secured with: Tape Dental Injury: Teeth and Oropharynx as per pre-operative assessment

## 2016-09-02 NOTE — Anesthesia Preprocedure Evaluation (Signed)
Anesthesia Evaluation  Patient identified by MRN, date of birth, ID band Patient awake    Reviewed: Allergy & Precautions, H&P , NPO status , Patient's Chart, lab work & pertinent test results  Airway Mallampati: II   Neck ROM: full    Dental   Pulmonary former smoker,    breath sounds clear to auscultation       Cardiovascular hypertension, + CAD and + Cardiac Stents   Rhythm:regular Rate:Normal     Neuro/Psych    GI/Hepatic GERD  ,  Endo/Other    Renal/GU      Musculoskeletal   Abdominal   Peds  Hematology   Anesthesia Other Findings   Reproductive/Obstetrics                             Anesthesia Physical Anesthesia Plan  ASA: III  Anesthesia Plan: General   Post-op Pain Management:    Induction: Intravenous  Airway Management Planned: Oral ETT  Additional Equipment:   Intra-op Plan:   Post-operative Plan: Extubation in OR  Informed Consent: I have reviewed the patients History and Physical, chart, labs and discussed the procedure including the risks, benefits and alternatives for the proposed anesthesia with the patient or authorized representative who has indicated his/her understanding and acceptance.     Plan Discussed with: CRNA, Anesthesiologist and Surgeon  Anesthesia Plan Comments:         Anesthesia Quick Evaluation

## 2016-09-07 ENCOUNTER — Encounter (HOSPITAL_COMMUNITY): Payer: Self-pay | Admitting: General Surgery

## 2016-12-02 DIAGNOSIS — I2510 Atherosclerotic heart disease of native coronary artery without angina pectoris: Secondary | ICD-10-CM | POA: Diagnosis not present

## 2016-12-02 DIAGNOSIS — I10 Essential (primary) hypertension: Secondary | ICD-10-CM | POA: Diagnosis not present

## 2016-12-30 DIAGNOSIS — I2510 Atherosclerotic heart disease of native coronary artery without angina pectoris: Secondary | ICD-10-CM | POA: Diagnosis not present

## 2016-12-30 DIAGNOSIS — I10 Essential (primary) hypertension: Secondary | ICD-10-CM | POA: Diagnosis not present

## 2017-02-17 DIAGNOSIS — I10 Essential (primary) hypertension: Secondary | ICD-10-CM | POA: Diagnosis not present

## 2017-02-17 DIAGNOSIS — I2510 Atherosclerotic heart disease of native coronary artery without angina pectoris: Secondary | ICD-10-CM | POA: Diagnosis not present

## 2017-03-16 DIAGNOSIS — I10 Essential (primary) hypertension: Secondary | ICD-10-CM | POA: Diagnosis not present

## 2017-03-16 DIAGNOSIS — I2510 Atherosclerotic heart disease of native coronary artery without angina pectoris: Secondary | ICD-10-CM | POA: Diagnosis not present

## 2017-04-19 DIAGNOSIS — I2510 Atherosclerotic heart disease of native coronary artery without angina pectoris: Secondary | ICD-10-CM | POA: Diagnosis not present

## 2017-04-19 DIAGNOSIS — I10 Essential (primary) hypertension: Secondary | ICD-10-CM | POA: Diagnosis not present

## 2017-06-02 DIAGNOSIS — I2510 Atherosclerotic heart disease of native coronary artery without angina pectoris: Secondary | ICD-10-CM | POA: Diagnosis not present

## 2017-06-02 DIAGNOSIS — I10 Essential (primary) hypertension: Secondary | ICD-10-CM | POA: Diagnosis not present

## 2017-07-26 DIAGNOSIS — I2510 Atherosclerotic heart disease of native coronary artery without angina pectoris: Secondary | ICD-10-CM | POA: Diagnosis not present

## 2017-07-26 DIAGNOSIS — I10 Essential (primary) hypertension: Secondary | ICD-10-CM | POA: Diagnosis not present

## 2017-11-05 DIAGNOSIS — I10 Essential (primary) hypertension: Secondary | ICD-10-CM | POA: Diagnosis not present

## 2017-11-05 DIAGNOSIS — I2510 Atherosclerotic heart disease of native coronary artery without angina pectoris: Secondary | ICD-10-CM | POA: Diagnosis not present

## 2017-11-23 DIAGNOSIS — I2510 Atherosclerotic heart disease of native coronary artery without angina pectoris: Secondary | ICD-10-CM | POA: Diagnosis not present

## 2017-11-23 DIAGNOSIS — Z87891 Personal history of nicotine dependence: Secondary | ICD-10-CM | POA: Diagnosis not present

## 2017-11-23 DIAGNOSIS — E7800 Pure hypercholesterolemia, unspecified: Secondary | ICD-10-CM | POA: Diagnosis not present

## 2017-11-23 DIAGNOSIS — Z2821 Immunization not carried out because of patient refusal: Secondary | ICD-10-CM | POA: Diagnosis not present

## 2017-11-23 DIAGNOSIS — I10 Essential (primary) hypertension: Secondary | ICD-10-CM | POA: Diagnosis not present

## 2017-11-23 DIAGNOSIS — Z299 Encounter for prophylactic measures, unspecified: Secondary | ICD-10-CM | POA: Diagnosis not present

## 2017-11-23 DIAGNOSIS — C189 Malignant neoplasm of colon, unspecified: Secondary | ICD-10-CM | POA: Diagnosis not present

## 2017-11-23 DIAGNOSIS — Z6837 Body mass index (BMI) 37.0-37.9, adult: Secondary | ICD-10-CM | POA: Diagnosis not present

## 2017-12-29 DIAGNOSIS — I10 Essential (primary) hypertension: Secondary | ICD-10-CM | POA: Diagnosis not present

## 2017-12-29 DIAGNOSIS — I2510 Atherosclerotic heart disease of native coronary artery without angina pectoris: Secondary | ICD-10-CM | POA: Diagnosis not present

## 2018-01-07 DIAGNOSIS — Z6837 Body mass index (BMI) 37.0-37.9, adult: Secondary | ICD-10-CM | POA: Diagnosis not present

## 2018-01-07 DIAGNOSIS — Z299 Encounter for prophylactic measures, unspecified: Secondary | ICD-10-CM | POA: Diagnosis not present

## 2018-01-07 DIAGNOSIS — I10 Essential (primary) hypertension: Secondary | ICD-10-CM | POA: Diagnosis not present

## 2018-01-07 DIAGNOSIS — R3989 Other symptoms and signs involving the genitourinary system: Secondary | ICD-10-CM | POA: Diagnosis not present

## 2018-01-07 DIAGNOSIS — Z713 Dietary counseling and surveillance: Secondary | ICD-10-CM | POA: Diagnosis not present

## 2018-01-26 DIAGNOSIS — I2510 Atherosclerotic heart disease of native coronary artery without angina pectoris: Secondary | ICD-10-CM | POA: Diagnosis not present

## 2018-01-26 DIAGNOSIS — I10 Essential (primary) hypertension: Secondary | ICD-10-CM | POA: Diagnosis not present

## 2018-02-23 DIAGNOSIS — Z1339 Encounter for screening examination for other mental health and behavioral disorders: Secondary | ICD-10-CM | POA: Diagnosis not present

## 2018-02-23 DIAGNOSIS — Z0000 Encounter for general adult medical examination without abnormal findings: Secondary | ICD-10-CM | POA: Diagnosis not present

## 2018-02-23 DIAGNOSIS — Z79899 Other long term (current) drug therapy: Secondary | ICD-10-CM | POA: Diagnosis not present

## 2018-02-23 DIAGNOSIS — E7800 Pure hypercholesterolemia, unspecified: Secondary | ICD-10-CM | POA: Diagnosis not present

## 2018-02-23 DIAGNOSIS — Z125 Encounter for screening for malignant neoplasm of prostate: Secondary | ICD-10-CM | POA: Diagnosis not present

## 2018-02-23 DIAGNOSIS — Z6837 Body mass index (BMI) 37.0-37.9, adult: Secondary | ICD-10-CM | POA: Diagnosis not present

## 2018-02-23 DIAGNOSIS — I2510 Atherosclerotic heart disease of native coronary artery without angina pectoris: Secondary | ICD-10-CM | POA: Diagnosis not present

## 2018-02-23 DIAGNOSIS — Z299 Encounter for prophylactic measures, unspecified: Secondary | ICD-10-CM | POA: Diagnosis not present

## 2018-02-23 DIAGNOSIS — Z7189 Other specified counseling: Secondary | ICD-10-CM | POA: Diagnosis not present

## 2018-02-23 DIAGNOSIS — Z1331 Encounter for screening for depression: Secondary | ICD-10-CM | POA: Diagnosis not present

## 2018-02-23 DIAGNOSIS — R5383 Other fatigue: Secondary | ICD-10-CM | POA: Diagnosis not present

## 2018-02-23 DIAGNOSIS — I10 Essential (primary) hypertension: Secondary | ICD-10-CM | POA: Diagnosis not present

## 2018-02-23 DIAGNOSIS — Z1211 Encounter for screening for malignant neoplasm of colon: Secondary | ICD-10-CM | POA: Diagnosis not present

## 2018-02-24 DIAGNOSIS — I10 Essential (primary) hypertension: Secondary | ICD-10-CM | POA: Diagnosis not present

## 2018-02-24 DIAGNOSIS — I2510 Atherosclerotic heart disease of native coronary artery without angina pectoris: Secondary | ICD-10-CM | POA: Diagnosis not present

## 2018-03-25 DIAGNOSIS — I2510 Atherosclerotic heart disease of native coronary artery without angina pectoris: Secondary | ICD-10-CM | POA: Diagnosis not present

## 2018-03-25 DIAGNOSIS — I10 Essential (primary) hypertension: Secondary | ICD-10-CM | POA: Diagnosis not present

## 2018-05-10 DIAGNOSIS — I2510 Atherosclerotic heart disease of native coronary artery without angina pectoris: Secondary | ICD-10-CM | POA: Diagnosis not present

## 2018-05-10 DIAGNOSIS — I10 Essential (primary) hypertension: Secondary | ICD-10-CM | POA: Diagnosis not present

## 2018-05-25 DIAGNOSIS — Z6828 Body mass index (BMI) 28.0-28.9, adult: Secondary | ICD-10-CM | POA: Diagnosis not present

## 2018-05-25 DIAGNOSIS — I2510 Atherosclerotic heart disease of native coronary artery without angina pectoris: Secondary | ICD-10-CM | POA: Diagnosis not present

## 2018-05-25 DIAGNOSIS — Z299 Encounter for prophylactic measures, unspecified: Secondary | ICD-10-CM | POA: Diagnosis not present

## 2018-05-25 DIAGNOSIS — I10 Essential (primary) hypertension: Secondary | ICD-10-CM | POA: Diagnosis not present

## 2018-05-25 DIAGNOSIS — Z713 Dietary counseling and surveillance: Secondary | ICD-10-CM | POA: Diagnosis not present

## 2018-06-01 DIAGNOSIS — E042 Nontoxic multinodular goiter: Secondary | ICD-10-CM | POA: Diagnosis not present

## 2018-06-01 DIAGNOSIS — K8020 Calculus of gallbladder without cholecystitis without obstruction: Secondary | ICD-10-CM | POA: Diagnosis not present

## 2018-06-01 DIAGNOSIS — I2510 Atherosclerotic heart disease of native coronary artery without angina pectoris: Secondary | ICD-10-CM | POA: Diagnosis not present

## 2018-06-01 DIAGNOSIS — R634 Abnormal weight loss: Secondary | ICD-10-CM | POA: Diagnosis not present

## 2018-06-01 DIAGNOSIS — Z85038 Personal history of other malignant neoplasm of large intestine: Secondary | ICD-10-CM | POA: Diagnosis not present

## 2018-06-01 DIAGNOSIS — I700 Atherosclerosis of aorta: Secondary | ICD-10-CM | POA: Diagnosis not present

## 2018-06-01 DIAGNOSIS — J439 Emphysema, unspecified: Secondary | ICD-10-CM | POA: Diagnosis not present

## 2018-06-01 DIAGNOSIS — K838 Other specified diseases of biliary tract: Secondary | ICD-10-CM | POA: Diagnosis not present

## 2018-06-01 DIAGNOSIS — R748 Abnormal levels of other serum enzymes: Secondary | ICD-10-CM | POA: Diagnosis not present

## 2018-06-14 ENCOUNTER — Inpatient Hospital Stay (HOSPITAL_COMMUNITY)
Admission: AD | Admit: 2018-06-14 | Discharge: 2018-06-19 | DRG: 416 | Disposition: A | Discharge status: Home / Self Care | Payer: Medicare HMO | Source: Ambulatory Visit | Attending: General Surgery | Admitting: General Surgery

## 2018-06-14 ENCOUNTER — Ambulatory Visit: Payer: Medicare HMO | Admitting: General Surgery

## 2018-06-14 ENCOUNTER — Encounter: Payer: Self-pay | Admitting: General Surgery

## 2018-06-14 ENCOUNTER — Other Ambulatory Visit: Payer: Self-pay

## 2018-06-14 ENCOUNTER — Encounter (HOSPITAL_COMMUNITY): Payer: Self-pay | Admitting: *Deleted

## 2018-06-14 ENCOUNTER — Encounter (INDEPENDENT_AMBULATORY_CARE_PROVIDER_SITE_OTHER): Payer: Self-pay

## 2018-06-14 ENCOUNTER — Inpatient Hospital Stay (HOSPITAL_COMMUNITY): Payer: Medicare HMO

## 2018-06-14 VITALS — BP 148/94 | HR 94 | Temp 97.5°F | Resp 16 | Wt 183.0 lb

## 2018-06-14 DIAGNOSIS — Z7982 Long term (current) use of aspirin: Secondary | ICD-10-CM | POA: Diagnosis not present

## 2018-06-14 DIAGNOSIS — K8021 Calculus of gallbladder without cholecystitis with obstruction: Secondary | ICD-10-CM

## 2018-06-14 DIAGNOSIS — K838 Other specified diseases of biliary tract: Secondary | ICD-10-CM | POA: Diagnosis not present

## 2018-06-14 DIAGNOSIS — C186 Malignant neoplasm of descending colon: Secondary | ICD-10-CM | POA: Diagnosis not present

## 2018-06-14 DIAGNOSIS — Z8342 Family history of familial hypercholesterolemia: Secondary | ICD-10-CM | POA: Diagnosis not present

## 2018-06-14 DIAGNOSIS — Z9049 Acquired absence of other specified parts of digestive tract: Secondary | ICD-10-CM | POA: Diagnosis not present

## 2018-06-14 DIAGNOSIS — K8050 Calculus of bile duct without cholangitis or cholecystitis without obstruction: Secondary | ICD-10-CM | POA: Diagnosis not present

## 2018-06-14 DIAGNOSIS — Z85038 Personal history of other malignant neoplasm of large intestine: Secondary | ICD-10-CM

## 2018-06-14 DIAGNOSIS — Z885 Allergy status to narcotic agent status: Secondary | ICD-10-CM | POA: Diagnosis not present

## 2018-06-14 DIAGNOSIS — K8010 Calculus of gallbladder with chronic cholecystitis without obstruction: Secondary | ICD-10-CM | POA: Diagnosis not present

## 2018-06-14 DIAGNOSIS — R17 Unspecified jaundice: Secondary | ICD-10-CM | POA: Diagnosis not present

## 2018-06-14 DIAGNOSIS — D649 Anemia, unspecified: Secondary | ICD-10-CM | POA: Diagnosis not present

## 2018-06-14 DIAGNOSIS — I998 Other disorder of circulatory system: Secondary | ICD-10-CM | POA: Diagnosis not present

## 2018-06-14 DIAGNOSIS — Z79899 Other long term (current) drug therapy: Secondary | ICD-10-CM | POA: Diagnosis not present

## 2018-06-14 DIAGNOSIS — I10 Essential (primary) hypertension: Secondary | ICD-10-CM | POA: Diagnosis not present

## 2018-06-14 DIAGNOSIS — I2510 Atherosclerotic heart disease of native coronary artery without angina pectoris: Secondary | ICD-10-CM | POA: Diagnosis present

## 2018-06-14 DIAGNOSIS — K8051 Calculus of bile duct without cholangitis or cholecystitis with obstruction: Secondary | ICD-10-CM | POA: Diagnosis not present

## 2018-06-14 DIAGNOSIS — Z720 Tobacco use: Secondary | ICD-10-CM

## 2018-06-14 DIAGNOSIS — Z5331 Laparoscopic surgical procedure converted to open procedure: Secondary | ICD-10-CM

## 2018-06-14 DIAGNOSIS — Z8249 Family history of ischemic heart disease and other diseases of the circulatory system: Secondary | ICD-10-CM | POA: Diagnosis not present

## 2018-06-14 DIAGNOSIS — C189 Malignant neoplasm of colon, unspecified: Secondary | ICD-10-CM

## 2018-06-14 DIAGNOSIS — K8071 Calculus of gallbladder and bile duct without cholecystitis with obstruction: Principal | ICD-10-CM | POA: Diagnosis present

## 2018-06-14 DIAGNOSIS — R945 Abnormal results of liver function studies: Secondary | ICD-10-CM

## 2018-06-14 DIAGNOSIS — K219 Gastro-esophageal reflux disease without esophagitis: Secondary | ICD-10-CM | POA: Diagnosis present

## 2018-06-14 DIAGNOSIS — M109 Gout, unspecified: Secondary | ICD-10-CM | POA: Diagnosis not present

## 2018-06-14 DIAGNOSIS — E785 Hyperlipidemia, unspecified: Secondary | ICD-10-CM | POA: Diagnosis present

## 2018-06-14 DIAGNOSIS — R109 Unspecified abdominal pain: Secondary | ICD-10-CM | POA: Diagnosis present

## 2018-06-14 DIAGNOSIS — Z89411 Acquired absence of right great toe: Secondary | ICD-10-CM

## 2018-06-14 DIAGNOSIS — R7989 Other specified abnormal findings of blood chemistry: Secondary | ICD-10-CM

## 2018-06-14 DIAGNOSIS — K831 Obstruction of bile duct: Secondary | ICD-10-CM | POA: Diagnosis not present

## 2018-06-14 DIAGNOSIS — R935 Abnormal findings on diagnostic imaging of other abdominal regions, including retroperitoneum: Secondary | ICD-10-CM | POA: Diagnosis not present

## 2018-06-14 DIAGNOSIS — K8070 Calculus of gallbladder and bile duct without cholecystitis without obstruction: Secondary | ICD-10-CM | POA: Diagnosis present

## 2018-06-14 DIAGNOSIS — Z87891 Personal history of nicotine dependence: Secondary | ICD-10-CM | POA: Diagnosis not present

## 2018-06-14 DIAGNOSIS — K8020 Calculus of gallbladder without cholecystitis without obstruction: Secondary | ICD-10-CM | POA: Diagnosis not present

## 2018-06-14 LAB — COMPREHENSIVE METABOLIC PANEL
ALT: 79 U/L — ABNORMAL HIGH (ref 0–44)
ANION GAP: 6 (ref 5–15)
AST: 127 U/L — ABNORMAL HIGH (ref 15–41)
Albumin: 3.1 g/dL — ABNORMAL LOW (ref 3.5–5.0)
Alkaline Phosphatase: 909 U/L — ABNORMAL HIGH (ref 38–126)
BUN: 10 mg/dL (ref 8–23)
CHLORIDE: 104 mmol/L (ref 98–111)
CO2: 27 mmol/L (ref 22–32)
CREATININE: 0.91 mg/dL (ref 0.61–1.24)
Calcium: 9.5 mg/dL (ref 8.9–10.3)
GFR calc Af Amer: >60 mL/min (ref >60)
Glucose, Bld: 131 mg/dL — ABNORMAL HIGH (ref 70–99)
Potassium: 4.2 mmol/L (ref 3.5–5.1)
SODIUM: 137 mmol/L (ref 135–145)
Total Bilirubin: 3.7 mg/dL — ABNORMAL HIGH (ref 0.3–1.2)
Total Protein: 6.4 g/dL — ABNORMAL LOW (ref 6.5–8.1)

## 2018-06-14 LAB — CBC WITH DIFFERENTIAL/PLATELET
Basophils Absolute: 0.1 10*3/uL (ref 0.0–0.1)
Basophils Relative: 1 %
Eosinophils Absolute: 0.1 10*3/uL (ref 0.0–0.7)
Eosinophils Relative: 2 %
HEMATOCRIT: 35.3 % — AB (ref 39.0–52.0)
HEMOGLOBIN: 11.8 g/dL — AB (ref 13.0–17.0)
LYMPHS ABS: 1.1 10*3/uL (ref 0.7–4.0)
Lymphocytes Relative: 12 %
MCH: 33.1 pg (ref 26.0–34.0)
MCHC: 33.4 g/dL (ref 30.0–36.0)
MCV: 98.9 fL (ref 78.0–100.0)
MONO ABS: 0.9 10*3/uL (ref 0.1–1.0)
MONOS PCT: 10 %
NEUTROS ABS: 7 10*3/uL (ref 1.7–7.7)
NEUTROS PCT: 75 %
Platelets: 231 10*3/uL (ref 150–400)
RBC: 3.57 MIL/uL — ABNORMAL LOW (ref 4.22–5.81)
RDW: 14.2 % (ref 11.5–15.5)
WBC: 9.2 10*3/uL (ref 4.0–10.5)

## 2018-06-14 LAB — PHOSPHORUS: Phosphorus: 2.6 mg/dL (ref 2.5–4.6)

## 2018-06-14 LAB — MAGNESIUM: MAGNESIUM: 1.8 mg/dL (ref 1.7–2.4)

## 2018-06-14 MED ORDER — HEPARIN SODIUM (PORCINE) 5000 UNIT/ML IJ SOLN
5000.0000 [IU] | Freq: Three times a day (TID) | INTRAMUSCULAR | Status: DC
  Administered 2018-06-14: 5000 [IU] via SUBCUTANEOUS
  Filled 2018-06-14 (×2): qty 1

## 2018-06-14 MED ORDER — ONDANSETRON HCL 4 MG PO TABS: 4.0000 mg | ORAL_TABLET | Freq: Four times a day (QID) | ORAL | Status: DC | PRN

## 2018-06-14 MED ORDER — SODIUM CHLORIDE 0.9 % IV SOLN
INTRAVENOUS | Status: DC
  Administered 2018-06-14 – 2018-06-19 (×9): via INTRAVENOUS

## 2018-06-14 MED ORDER — PANTOPRAZOLE SODIUM 40 MG PO TBEC
40.0000 mg | DELAYED_RELEASE_TABLET | Freq: Every day | ORAL | Status: DC
  Administered 2018-06-14 – 2018-06-19 (×5): 40 mg via ORAL
  Filled 2018-06-14 (×6): qty 1

## 2018-06-14 MED ORDER — GADOBENATE DIMEGLUMINE 529 MG/ML IV SOLN
17.0000 mL | Freq: Once | INTRAVENOUS | Status: AC | PRN
  Administered 2018-06-14: 17 mL via INTRAVENOUS

## 2018-06-14 MED ORDER — ACETAMINOPHEN 650 MG RE SUPP: 650.0000 mg | Freq: Four times a day (QID) | RECTAL | Status: DC | PRN

## 2018-06-14 MED ORDER — ACETAMINOPHEN 325 MG PO TABS: 650.0000 mg | ORAL_TABLET | Freq: Four times a day (QID) | ORAL | Status: DC | PRN

## 2018-06-14 MED ORDER — ONDANSETRON HCL 4 MG/2ML IJ SOLN
4.0000 mg | Freq: Four times a day (QID) | INTRAMUSCULAR | Status: DC | PRN
  Administered 2018-06-17: 4 mg via INTRAVENOUS
  Filled 2018-06-14: qty 2

## 2018-06-14 MED ORDER — ASPIRIN 325 MG PO TABS
325.0000 mg | ORAL_TABLET | Freq: Every day | ORAL | Status: DC
  Administered 2018-06-15: 325 mg via ORAL
  Filled 2018-06-14 (×2): qty 1

## 2018-06-14 NOTE — Progress Notes (Signed)
Informed by Radiology that patient needs his lab drawn and resulted prior to MRCP. Per lab phlebomotist will draw required labs.

## 2018-06-14 NOTE — Patient Instructions (Signed)
Cholelithiasis °Cholelithiasis is a form of gallbladder disease in which gallstones form in the gallbladder. The gallbladder is an organ that stores bile. Bile is made in the liver, and it helps to digest fats. Gallstones begin as small crystals and slowly grow into stones. They may cause no symptoms until the gallbladder tightens (contracts) and a gallstone is blocking the duct (gallbladder attack), which can cause pain. Cholelithiasis is also referred to as gallstones. °There are two main types of gallstones: °· Cholesterol stones. These are made of hardened cholesterol and are usually yellow-green in color. They are the most common type of gallstone. Cholesterol is a white, waxy, fat-like substance that is made in the liver. °· Pigment stones. These are dark in color and are made of a red-yellow substance that forms when hemoglobin from red blood cells breaks down (bilirubin). ° °What are the causes? °This condition may be caused by an imbalance in the substances that bile is made of. This can happen if the bile: °· Has too much bilirubin. °· Has too much cholesterol. °· Does not have enough bile salts. These salts help the body absorb and digest fats. ° °In some cases, this condition can also be caused by the gallbladder not emptying completely or often enough. °What increases the risk? °The following factors may make you more likely to develop this condition: °· Being male. °· Having multiple pregnancies. Health care providers sometimes advise removing diseased gallbladders before future pregnancies. °· Eating a diet that is heavy in fried foods, fat, and refined carbohydrates, like white bread and white rice. °· Being obese. °· Being older than age 40. °· Prolonged use of medicines that contain male hormones (estrogen). °· Having diabetes mellitus. °· Rapidly losing weight. °· Having a family history of gallstones. °· Being of American Indian or Mexican descent. °· Having an intestinal disease such as  Crohn disease. °· Having metabolic syndrome. °· Having cirrhosis. °· Having severe types of anemia such as sickle cell anemia. ° °What are the signs or symptoms? °In most cases, there are no symptoms. These are known as silent gallstones. If a gallstone blocks the bile ducts, it can cause a gallbladder attack. The main symptom of a gallbladder attack is sudden pain in the upper right abdomen. The pain usually comes at night or after eating a large meal. The pain can last for one or several hours and can spread to the right shoulder or chest. °If the bile duct is blocked for more than a few hours, it can cause infection or inflammation of the gallbladder, liver, or pancreas, which may cause: °· Nausea. °· Vomiting. °· Abdominal pain that lasts for 5 hours or more. °· Fever or chills. °· Yellowing of the skin or the whites of the eyes (jaundice). °· Dark urine. °· Light-colored stools. ° °How is this diagnosed? °This condition may be diagnosed based on: °· A physical exam. °· Your medical history. °· An ultrasound of your gallbladder. °· CT scan. °· MRI. °· Blood tests to check for signs of infection or inflammation. °· A scan of your gallbladder and bile ducts (biliary system) using nonharmful radioactive material and special cameras that can see the radioactive material (cholescintigram). This test checks to see how your gallbladder contracts and whether bile ducts are blocked. °· Inserting a small tube with a camera on the end (endoscope) through your mouth to inspect bile ducts and check for blockages (endoscopic retrograde cholangiopancreatogram). ° °How is this treated? °Treatment for gallstones depends on the   severity of the condition. Silent gallstones do not need treatment. If the gallstones cause a gallbladder attack or other symptoms, treatment may be required. Options for treatment include: °· Surgery to remove the gallbladder (cholecystectomy). This is the most common treatment. °· Medicines to dissolve  gallstones. These are most effective at treating small gallstones. You may need to take medicines for up to 6-12 months. °· Shock wave treatment (extracorporeal biliary lithotripsy). In this treatment, an ultrasound machine sends shock waves to the gallbladder to break gallstones into smaller pieces. These pieces can then be passed into the intestines or be dissolved by medicine. This is rarely used. °· Removing gallstones through endoscopic retrograde cholangiopancreatogram. A small basket can be attached to the endoscope and used to capture and remove gallstones. ° °Follow these instructions at home: °· Take over-the-counter and prescription medicines only as told by your health care provider. °· Maintain a healthy weight and follow a healthy diet. This includes: °? Reducing fatty foods, such as fried food. °? Reducing refined carbohydrates, like white bread and white rice. °? Increasing fiber. Aim for foods like almonds, fruit, and beans. °· Keep all follow-up visits as told by your health care provider. This is important. °Contact a health care provider if: °· You think you have had a gallbladder attack. °· You have been diagnosed with silent gallstones and you develop abdominal pain or indigestion. °Get help right away if: °· You have pain from a gallbladder attack that lasts for more than 2 hours. °· You have abdominal pain that lasts for more than 5 hours. °· You have a fever or chills. °· You have persistent nausea and vomiting. °· You develop jaundice. °· You have dark urine or light-colored stools. °Summary °· Cholelithiasis (also called gallstones) is a form of gallbladder disease in which gallstones form in the gallbladder. °· This condition is caused by an imbalance in the substances that make up bile. This can happen if the bile has too much cholesterol, too much bilirubin, or not enough bile salts. °· You are more likely to develop this condition if you are male, pregnant, using medicines with  estrogen, obese, older than age 40, or have a family history of gallstones. You may also develop gallstones if you have diabetes, an intestinal disease, cirrhosis, or metabolic syndrome. °· Treatment for gallstones depends on the severity of the condition. Silent gallstones do not need treatment. °· If gallstones cause a gallbladder attack or other symptoms, treatment may be needed. The most common treatment is surgery to remove the gallbladder. °This information is not intended to replace advice given to you by your health care provider. Make sure you discuss any questions you have with your health care provider. °Document Released: 10/29/2005 Document Revised: 07/19/2016 Document Reviewed: 07/19/2016 °Elsevier Interactive Patient Education © 2018 [employer] ° °

## 2018-06-14 NOTE — H&P (Signed)
History and Physical    Brandon [name]:[id number] DOB: 1941-03-31 DOA: 06/14/2018  Referring MD/NP/PA: Dr. Arnoldo Morale PCP: Monico Blitz, MD  Patient coming from: Home  Chief Complaint: intermittent abdominal pain and jaundice  HPI: Brandon [name] is a 77 y.o. male 77 y/o with PMH significant for colon cancer s/p descending colectomy (in remission), GERD, Gout, HLD and HTN; who came to ED as direct admission from Dr, Arnoldo Morale office due to concerns for cholelithiasis with choledocholithiasis. Patient with intermittent episodes of abd pain around food, radiated to the RUQ; bloating sensation and nausea. Lately has also noticed increase orange color in his urine and lighter stools. Family has also reported yellowish discoloration of his skin.  Patient denies vomiting, fever, chills, hematuria, CP, palpitations, focal weakness, HA's, blurred vision or any other complaints. On my examination he even declined having any pain.  Of note, recent blood work done in outside facility demonstrated ALk phos of 648, mild transaminitis and CT scan showing gallstone in gallbladder.   Past Medical/Surgical History: Past Medical History:  Diagnosis Date  . Chest pain, unspecified   . Colon cancer ([place]) 1999  . GERD (gastroesophageal reflux disease)   . History of gout   . History of herniorrhaphy   . Hypercholesteremia   . Hypertension     Past Surgical History:  Procedure Laterality Date  . AMPUTATION OF REPLICATED TOES Right    big toe  . APPENDECTOMY    . CARDIAC CATHETERIZATION     stents x2  . COLONOSCOPY N/A 11/21/2014   Procedure: COLONOSCOPY;  Surgeon: Rogene Houston, MD;  Location: [facility];  Service: Endoscopy;  Laterality: N/A;  830  . HERNIA REPAIR     Incisional hernia  . INGUINAL HERNIA REPAIR Right 09/02/2016   Procedure: HERNIA REPAIR INGUINAL ADULT WITH MESH;  Surgeon: Aviva Signs, MD;  Location: [facility] ORS;  Service: General;  Laterality: Right;  . Left foot surgery  Left    lawn mower accidnet  . PARTIAL COLECTOMY    . Right great toe surgery    . TONSILLECTOMY      Social History:  reports that he quit smoking about 8 years ago. His smoking use included cigarettes. He has a 30.00 pack-year smoking history. His smokeless tobacco use includes snuff. He reports that he drinks alcohol. He reports that he does not use drugs.  Allergies: Allergies  Allergen Reactions  . Oxycodone   . Oxycontin [Oxycodone Hcl]     Hallucinations     Family History:  Patient reported father and mother with HTN and HLD; otherwise non contributory.  Prior to Admission medications   Medication Sig Start Date End Date Taking? Authorizing Provider  aspirin 325 MG tablet Take 325 mg by mouth daily.     Yes [provider]  clobetasol (OLUX) 0.05 % topical foam Apply topically 2 (two) times daily.   Yes [provider]  famotidine (PEPCID) 10 MG tablet Take 10 mg by mouth 2 (two) times daily as needed for heartburn or indigestion.    Yes [provider]  magnesium hydroxide (MILK OF MAGNESIA) 400 MG/5ML suspension Take by mouth daily as needed for mild constipation.   Yes [provider]  simvastatin (ZOCOR) 20 MG tablet Take 20 mg by mouth at bedtime.    Yes [provider]  nitroGLYCERIN (NITROSTAT) 0.4 MG SL tablet Place 0.4 mg under the tongue every 5 (five) minutes as needed for chest pain.     [provider]    Review of Systems: 10 system reviewed and neg except as otherwise mentioned on HPI.  Physical Exam: Vitals:   06/14/18 1121  BP: (!) 148/79  Pulse: (!) 109  Resp: 18  Temp: 98.2 F (36.8 C)  TempSrc: Oral  SpO2: 100%  Weight: 83 kg (183 lb)  Height: 5' 7"  (1.702 m)    Constitutional: NAD, calm, comfortable; afebrile and denies CP, nausea or vomiting.  Eyes: PERRL, lids and conjunctivae normal; mild icterus appreciated on exam. ENMT: Mucous membranes are moist. Posterior pharynx clear of any  exudate or lesions. No Thrush.  Neck: normal, supple, no masses, no thyromegaly Respiratory: clear to auscultation bilaterally, no wheezing, no crackles. Normal respiratory effort. No accessory muscle use.  Cardiovascular: Regular rate and rhythm, no murmurs / rubs / gallops. No extremity edema. 2+ pedal pulses. No carotid bruits.  Abdomen: no tenderness on exam, no masses palpated. No hepatosplenomegaly. Bowel sounds positive.  Musculoskeletal: no clubbing / cyanosis. No joint deformity upper and lower extremities. Good ROM, no contractures. Normal muscle tone.  Skin: no rashes, lesions, ulcers. No induration Neurologic: CN 2-12 grossly intact. Sensation intact, DTR normal. Strength 5/5 in all 4.  Psychiatric: Normal judgment and insight. Alert and oriented x 3. Normal mood.   Labs on Admission: I have personally reviewed the following labs and imaging studies  CBC: Recent Labs  Lab 06/14/18 1716  WBC 9.2  NEUTROABS 7.0  HGB 11.8*  HCT 35.3*  MCV 98.9  PLT 193   Basic Metabolic Panel: Recent Labs  Lab 06/14/18 1716  NA 137  K 4.2  CL 104  CO2 27  GLUCOSE 131*  BUN 10  CREATININE 0.91  CALCIUM 9.5  MG 1.8  PHOS 2.6   GFR: Estimated Creatinine Clearance: 72.3 mL/min (by C-G formula based on SCr of 0.91 mg/dL).   Liver Function Tests: Recent Labs  Lab 06/14/18 1716  AST 127*  ALT 79*  ALKPHOS 909*  BILITOT 3.7*  PROT 6.4*  ALBUMIN 3.1*      Radiological Exams on Admission: Mr 3d Recon At Scanner  Result Date: 06/14/2018 CLINICAL DATA:  Cholelithiasis, abnormal LFTs, dilated CBD EXAM: MRI ABDOMEN WITHOUT AND WITH CONTRAST (INCLUDING MRCP) TECHNIQUE: Multiplanar multisequence MR imaging of the abdomen was performed both before and after the administration of intravenous contrast. Heavily T2-weighted images of the biliary and pancreatic ducts were obtained, and three-dimensional MRCP images were rendered by post processing. CONTRAST:  66m MULTIHANCE GADOBENATE  DIMEGLUMINE 529 MG/ML IV SOLN COMPARISON:  [facility] CT abdomen/pelvis dated 06/01/2018 FINDINGS: Lower chest: Lung bases are clear. Hepatobiliary: Liver is within normal limits. No morphologic findings of cirrhosis. No suspicious/enhancing hepatic lesions. No hepatic steatosis. Layering small gallstones with gallbladder sludge (series 26/image 40). Distended gallbladder. No associated inflammatory changes. Mild intrahepatic ductal dilatation. Moderate extrahepatic ductal dilatation, measuring 15 mm. Associated 11 mm filling defect in the distal CBD at the ampulla (series 3/image 22). This was not conspicuous on prior CT. While an ampullary lesion is possible, the appearance favors a distal CBD stone. Pancreas:  Within normal limits. Spleen:  Within normal limits. Adrenals/Urinary Tract:  Adrenal glands are within normal limits. Kidneys are within normal limits.  No hydronephrosis. Stomach/Bowel: Stomach is notable for a tiny hiatal hernia. Visualized bowel is unremarkable. Vascular/Lymphatic:  No evidence of abdominal aortic aneurysm. No suspicious abdominal lymphadenopathy. Other:  No abdominal ascites. Musculoskeletal: No focal osseous lesions. IMPRESSION: Cholelithiasis, without associated findings to suggest acute cholecystitis. Intrahepatic and extrahepatic ductal dilatation. Dilated  common duct, measuring 15 mm. Associated 11 mm filling defect in the distal CBD at the ampulla, favoring a distal CBD stone. ERCP is suggested. Electronically Signed   By: Julian Hy M.D.   On: 06/14/2018 20:34   Mr Abdomen Mrcp Moise Boring Contast  Result Date: 06/14/2018 CLINICAL DATA:  Cholelithiasis, abnormal LFTs, dilated CBD EXAM: MRI ABDOMEN WITHOUT AND WITH CONTRAST (INCLUDING MRCP) TECHNIQUE: Multiplanar multisequence MR imaging of the abdomen was performed both before and after the administration of intravenous contrast. Heavily T2-weighted images of the biliary and pancreatic ducts were obtained, and  three-dimensional MRCP images were rendered by post processing. CONTRAST:  60m MULTIHANCE GADOBENATE DIMEGLUMINE 529 MG/ML IV SOLN COMPARISON:  [facility] CT abdomen/pelvis dated 06/01/2018 FINDINGS: Lower chest: Lung bases are clear. Hepatobiliary: Liver is within normal limits. No morphologic findings of cirrhosis. No suspicious/enhancing hepatic lesions. No hepatic steatosis. Layering small gallstones with gallbladder sludge (series 26/image 40). Distended gallbladder. No associated inflammatory changes. Mild intrahepatic ductal dilatation. Moderate extrahepatic ductal dilatation, measuring 15 mm. Associated 11 mm filling defect in the distal CBD at the ampulla (series 3/image 22). This was not conspicuous on prior CT. While an ampullary lesion is possible, the appearance favors a distal CBD stone. Pancreas:  Within normal limits. Spleen:  Within normal limits. Adrenals/Urinary Tract:  Adrenal glands are within normal limits. Kidneys are within normal limits.  No hydronephrosis. Stomach/Bowel: Stomach is notable for a tiny hiatal hernia. Visualized bowel is unremarkable. Vascular/Lymphatic:  No evidence of abdominal aortic aneurysm. No suspicious abdominal lymphadenopathy. Other:  No abdominal ascites. Musculoskeletal: No focal osseous lesions. IMPRESSION: Cholelithiasis, without associated findings to suggest acute cholecystitis. Intrahepatic and extrahepatic ductal dilatation. Dilated common duct, measuring 15 mm. Associated 11 mm filling defect in the distal CBD at the ampulla, favoring a distal CBD stone. ERCP is suggested. Electronically Signed   By: SJulian HyM.D.   On: 06/14/2018 20:34    EKG: none   Assessment/Plan 1-Cholelithiasis with choledocholithiasis -patient with mild jaundice and also significantly elevated LFT's -MRCP done stat on admission demonstrating CBD dilatation and extrahepatic duct congestion. This favor distal CBD stone and congestion. -GI has been consulted for  ERCP -patient is afebrile and with normal WBC's; will hold on antibiotics. -general surgery on board and looking to performed cholecystectomy after ERCP and stone removal. -will allow CLD for now. -PRN antiemetics and analgesics.   2-[place] -continue outpatient screening and follow up with oncology  3-HTN (hypertension) -BP stable and well controlled -will follow VS -has following just diet management as an outpatient lately  4- GERD (gastroesophageal reflux disease) -continue PPI  5-HLD (hyperlipidemia) -will hold statins in the setting of elevated LFT's -resume once liver function stable.  6-hx of CAD -non-obstructive -continue ASA and resume statins when liver function back to normal. -no CP or SOB reported.    DVT prophylaxis: heparin  Code Status: Full Family Communication: Son at bedside  Disposition Plan: anticipate discharge back home when work up/surgery completed. Consults called: GI Admission status: inpatient, LOS > 2 midnights, med-surg bed.   Time Spent: 70 minutes.  CBarton DuboisMD [facility] Pager [phone] If 7PM-7AM, please contact night-coverage [email or web address] Password [email or web address] 06/14/2018, 4:48 PM

## 2018-06-14 NOTE — Progress Notes (Signed)
Brandon [name]; [id number]; 1941/07/15   HPI Patient is a 77 year old white male who was referred to my care by Dr. Brigitte Pulse for evaluation and treatment of gallbladder obstruction.  Patient states that over the past few weeks, he has been having intermittent episodes of upper abdominal discomfort.  He does have right upper quadrant abdominal pain intermittently that does radiate to the right side.  He also states that he has had some yellowish skin and orange-colored urine recently.  No fever or chills have been noted.  Most recently, the patient had blood test done at an outside facility which revealed a total bilirubin of 4.3, alkaline phosphatase of 648, SGOT of 65, SGPT of 66.  He did not have a leukocytosis.  He somewhat feels bloated, but has no nausea or vomiting at the present time.  He has 3 out of 10 abdominal pain at the present time. Past Medical History:  Diagnosis Date  . Chest pain, unspecified   . Colon cancer ([place]) 1999  . GERD (gastroesophageal reflux disease)   . History of gout   . History of herniorrhaphy   . Hypercholesteremia   . Hypertension     Past Surgical History:  Procedure Laterality Date  . AMPUTATION OF REPLICATED TOES Right    big toe  . APPENDECTOMY    . CARDIAC CATHETERIZATION     stents x2  . COLONOSCOPY N/A 11/21/2014   Procedure: COLONOSCOPY;  Surgeon: Rogene Houston, MD;  Location: [facility];  Service: Endoscopy;  Laterality: N/A;  830  . HERNIA REPAIR     Incisional hernia  . INGUINAL HERNIA REPAIR Right 09/02/2016   Procedure: HERNIA REPAIR INGUINAL ADULT WITH MESH;  Surgeon: Aviva Signs, MD;  Location: [facility];  Service: General;  Laterality: Right;  . Left foot surgery Left    lawn mower accidnet  . PARTIAL COLECTOMY    . Right great toe surgery    . TONSILLECTOMY      History reviewed. No pertinent family history.  Current Outpatient Medications on File Prior to Visit  Medication Sig Dispense Refill  . aspirin 325 MG tablet Take 325  mg by mouth daily.      . clobetasol (OLUX) 0.05 % topical foam Apply topically 2 (two) times daily.    . famotidine (PEPCID) 10 MG tablet Take 10 mg by mouth 2 (two) times daily as needed for heartburn or indigestion.     . magnesium hydroxide (MILK OF MAGNESIA) 400 MG/5ML suspension Take by mouth daily as needed for mild constipation.    . nitroGLYCERIN (NITROSTAT) 0.4 MG SL tablet Place 0.4 mg under the tongue every 5 (five) minutes as needed for chest pain.     . simvastatin (ZOCOR) 20 MG tablet Take 20 mg by mouth at bedtime.      No current facility-administered medications on file prior to visit.     Allergies  Allergen Reactions  . Oxycodone   . Oxycontin [Oxycodone Hcl]     Hallucinations     Social History   Substance and Sexual Activity  Alcohol Use Yes   Comment: socially    Social History   Tobacco Use  Smoking Status Former Smoker  . Packs/day: 1.00  . Years: 30.00  . Pack years: 30.00  . Types: Cigarettes  . Last attempt to quit: 08/28/2009  . Years since quitting: 8.8  Smokeless Tobacco Current User  . Types: Snuff    Review of Systems  Constitutional: Negative.   HENT: Negative.  Eyes: Negative.   Respiratory: Negative.   Cardiovascular: Negative.   Gastrointestinal: Positive for abdominal pain, heartburn and nausea.  Genitourinary: Negative.   Musculoskeletal: Negative.   Skin: Positive for rash.  Neurological: Negative.   Endo/Heme/Allergies: Negative.   Psychiatric/Behavioral: Negative.     Objective   Vitals:   06/14/18 0922  BP: (!) 148/94  Pulse: 94  Resp: 16  Temp: (!) 97.5 F (36.4 C)    Physical Exam  Constitutional: He is oriented to person, place, and time. He appears well-developed and well-nourished. No distress.  HENT:  Head: Normocephalic and atraumatic.  Eyes: Scleral icterus is present.  Cardiovascular: Normal rate, regular rhythm and normal heart sounds. Exam reveals no gallop.  No murmur  heard. Pulmonary/Chest: Effort normal and breath sounds normal. No stridor. No respiratory distress. He has no wheezes. He has no rhonchi. He has no rales.  Abdominal: Soft. Normal appearance and bowel sounds are normal. There is no hepatosplenomegaly. There is no rebound, no guarding and negative Murphy's sign.  Neurological: He is alert and oriented to person, place, and time.  Skin: Skin is warm and dry.  Slightly yellow.  Vitals reviewed. CT scan of the abdomen done at another facility revealed hepatobiliary dilatation with the common bile duct measuring up to 12 mm.  Multiple small gallstones are present. Dr. Trena Platt notes reviewed Assessment  Cholelithiasis, dilated common bile duct, hyperbilirubinemia Plan   Patient will be admitted to [facility] for further evaluation and treatment.  He does not appear septic at this time.  Will repeat the labs and order an MRCP.  Further management is pending those results.

## 2018-06-15 ENCOUNTER — Encounter (HOSPITAL_COMMUNITY)
Admission: AD | Disposition: A | Discharge status: Home / Self Care | Payer: Self-pay | Source: Ambulatory Visit | Attending: Internal Medicine

## 2018-06-15 ENCOUNTER — Encounter (HOSPITAL_COMMUNITY): Payer: Self-pay | Admitting: *Deleted

## 2018-06-15 ENCOUNTER — Inpatient Hospital Stay (HOSPITAL_COMMUNITY): Payer: Medicare HMO

## 2018-06-15 DIAGNOSIS — K8051 Calculus of bile duct without cholangitis or cholecystitis with obstruction: Secondary | ICD-10-CM

## 2018-06-15 DIAGNOSIS — R17 Unspecified jaundice: Secondary | ICD-10-CM

## 2018-06-15 DIAGNOSIS — K8070 Calculus of gallbladder and bile duct without cholecystitis without obstruction: Secondary | ICD-10-CM

## 2018-06-15 HISTORY — PX: BILIARY STENT PLACEMENT: SHX5538

## 2018-06-15 HISTORY — PX: SPHINCTEROTOMY: SHX5544

## 2018-06-15 HISTORY — PX: ERCP: SHX5425

## 2018-06-15 LAB — CBC
HCT: 33.1 % — ABNORMAL LOW (ref 39.0–52.0)
HCT: 36.2 % — ABNORMAL LOW (ref 39.0–52.0)
Hemoglobin: 11.1 g/dL — ABNORMAL LOW (ref 13.0–17.0)
Hemoglobin: 12.2 g/dL — ABNORMAL LOW (ref 13.0–17.0)
MCH: 33 pg (ref 26.0–34.0)
MCH: 33.3 pg (ref 26.0–34.0)
MCHC: 33.5 g/dL (ref 30.0–36.0)
MCHC: 33.7 g/dL (ref 30.0–36.0)
MCV: 98.5 fL (ref 78.0–100.0)
MCV: 98.9 fL (ref 78.0–100.0)
PLATELETS: 203 10*3/uL (ref 150–400)
Platelets: 202 10*3/uL (ref 150–400)
RBC: 3.36 MIL/uL — ABNORMAL LOW (ref 4.22–5.81)
RBC: 3.66 MIL/uL — ABNORMAL LOW (ref 4.22–5.81)
RDW: 14.2 % (ref 11.5–15.5)
RDW: 14 % (ref 11.5–15.5)
WBC: 5.5 10*3/uL (ref 4.0–10.5)
WBC: 8.4 10*3/uL (ref 4.0–10.5)

## 2018-06-15 LAB — COMPREHENSIVE METABOLIC PANEL
ALK PHOS: 686 U/L — AB (ref 38–126)
ALT: 59 U/L — ABNORMAL HIGH (ref 0–44)
AST: 70 U/L — AB (ref 15–41)
Albumin: 2.8 g/dL — ABNORMAL LOW (ref 3.5–5.0)
Anion gap: 5 (ref 5–15)
BILIRUBIN TOTAL: 3.2 mg/dL — AB (ref 0.3–1.2)
BUN: 10 mg/dL (ref 8–23)
CALCIUM: 9.1 mg/dL (ref 8.9–10.3)
CO2: 27 mmol/L (ref 22–32)
CREATININE: 0.92 mg/dL (ref 0.61–1.24)
Chloride: 106 mmol/L (ref 98–111)
GFR calc Af Amer: >60 mL/min (ref >60)
Glucose, Bld: 111 mg/dL — ABNORMAL HIGH (ref 70–99)
Potassium: 4.1 mmol/L (ref 3.5–5.1)
Sodium: 138 mmol/L (ref 135–145)
Total Protein: 5.6 g/dL — ABNORMAL LOW (ref 6.5–8.1)

## 2018-06-15 LAB — BASIC METABOLIC PANEL
Anion gap: 6 (ref 5–15)
BUN: 8 mg/dL (ref 8–23)
CO2: 26 mmol/L (ref 22–32)
Calcium: 9.3 mg/dL (ref 8.9–10.3)
Chloride: 106 mmol/L (ref 98–111)
Creatinine, Ser: 0.83 mg/dL (ref 0.61–1.24)
GFR calc Af Amer: >60 mL/min (ref >60)
GFR calc non Af Amer: >60 mL/min (ref >60)
GLUCOSE: 98 mg/dL (ref 70–99)
Potassium: 4.2 mmol/L (ref 3.5–5.1)
Sodium: 138 mmol/L (ref 135–145)

## 2018-06-15 LAB — AMYLASE: Amylase: 123 U/L — ABNORMAL HIGH (ref 28–100)

## 2018-06-15 LAB — POCT I-STAT CREATININE: Creatinine, Ser: 0.8 mg/dL (ref 0.61–1.24)

## 2018-06-15 SURGERY — ERCP, WITH INTERVENTION IF INDICATED
Anesthesia: General | Site: Esophagus

## 2018-06-15 MED ORDER — FENTANYL CITRATE (PF) 100 MCG/2ML IJ SOLN
INTRAMUSCULAR | Status: AC
  Filled 2018-06-15: qty 2

## 2018-06-15 MED ORDER — PROMETHAZINE HCL 25 MG/ML IJ SOLN: 6.2500 mg | INTRAMUSCULAR | Status: DC | PRN

## 2018-06-15 MED ORDER — IOPAMIDOL (ISOVUE-M 300) INJECTION 61%
INTRAMUSCULAR | Status: DC | PRN
  Administered 2018-06-15: 50 mL via INTRATHECAL

## 2018-06-15 MED ORDER — SUCCINYLCHOLINE CHLORIDE 200 MG/10ML IV SOSY
PREFILLED_SYRINGE | INTRAVENOUS | Status: DC | PRN
  Administered 2018-06-15: 120 mg via INTRAVENOUS

## 2018-06-15 MED ORDER — IOPAMIDOL (ISOVUE-300) INJECTION 61%
INTRAVENOUS | Status: AC
  Filled 2018-06-15: qty 100

## 2018-06-15 MED ORDER — SODIUM CHLORIDE 0.9 % IV SOLN
2.0000 g | INTRAVENOUS | Status: DC
  Administered 2018-06-15 – 2018-06-18 (×5): 2 g via INTRAVENOUS
  Filled 2018-06-15 (×3): qty 20
  Filled 2018-06-15: qty 2
  Filled 2018-06-15: qty 20
  Filled 2018-06-15 (×2): qty 2
  Filled 2018-06-15: qty 20

## 2018-06-15 MED ORDER — SODIUM CHLORIDE 0.9 % IV SOLN: 1.0000 g | INTRAVENOUS | Status: DC

## 2018-06-15 MED ORDER — PROPOFOL 10 MG/ML IV BOLUS
INTRAVENOUS | Status: AC
  Filled 2018-06-15: qty 20

## 2018-06-15 MED ORDER — LACTATED RINGERS IV SOLN: INTRAVENOUS | Status: DC

## 2018-06-15 MED ORDER — LACTATED RINGERS IV SOLN
INTRAVENOUS | Status: DC | PRN
  Administered 2018-06-15: 13:00:00 via INTRAVENOUS

## 2018-06-15 MED ORDER — PROPOFOL 10 MG/ML IV BOLUS
INTRAVENOUS | Status: DC | PRN
  Administered 2018-06-15: 150 mg via INTRAVENOUS

## 2018-06-15 MED ORDER — MEPERIDINE HCL 100 MG/ML IJ SOLN: 6.2500 mg | INTRAMUSCULAR | Status: DC | PRN

## 2018-06-15 MED ORDER — STERILE WATER FOR IRRIGATION IR SOLN
Status: DC | PRN
  Administered 2018-06-15: 1000 mL

## 2018-06-15 MED ORDER — SODIUM CHLORIDE 0.9 % IV SOLN: INTRAVENOUS | Status: DC

## 2018-06-15 MED ORDER — SIMETHICONE 40 MG/0.6ML PO SUSP
ORAL | Status: AC
  Filled 2018-06-15: qty 0.6

## 2018-06-15 MED ORDER — FENTANYL CITRATE (PF) 100 MCG/2ML IJ SOLN
INTRAMUSCULAR | Status: DC | PRN
  Administered 2018-06-15 (×2): 50 ug via INTRAVENOUS

## 2018-06-15 MED ORDER — EPHEDRINE SULFATE 50 MG/ML IJ SOLN
INTRAMUSCULAR | Status: DC | PRN
  Administered 2018-06-15: 10 mg via INTRAVENOUS

## 2018-06-15 MED ORDER — HYDROMORPHONE HCL 1 MG/ML IJ SOLN: 0.2500 mg | INTRAMUSCULAR | Status: DC | PRN

## 2018-06-15 MED ORDER — GLUCAGON HCL RDNA (DIAGNOSTIC) 1 MG IJ SOLR
INTRAMUSCULAR | Status: DC | PRN
  Administered 2018-06-15: .25 mg via INTRAVENOUS

## 2018-06-15 MED ORDER — GLUCAGON HCL RDNA (DIAGNOSTIC) 1 MG IJ SOLR
INTRAMUSCULAR | Status: AC
  Filled 2018-06-15: qty 2

## 2018-06-15 NOTE — Transfer of Care (Signed)
Immediate Anesthesia Transfer of Care Note  Patient: Brandon [name]  Procedure(s) Performed: ENDOSCOPIC RETROGRADE CHOLANGIOPANCREATOGRAPHY (ERCP) (N/A Esophagus) BILIARY STENT PLACEMENT (N/A Esophagus) SPHINCTEROTOMY (N/A Esophagus)  Patient Location: PACU  Anesthesia Type:General  Level of Consciousness: awake and alert   Airway & Oxygen Therapy: Patient Spontanous Breathing  Post-op Assessment: Report given to RN  Post vital signs: Reviewed and stable  Last Vitals:  Vitals Value Taken Time  BP 131/64 06/15/2018  3:05 PM  Temp    Pulse 66 06/15/2018  3:09 PM  Resp 21 06/15/2018  3:09 PM  SpO2 97 % 06/15/2018  3:09 PM  Vitals shown include unvalidated device data.  Last Pain:  Vitals:   06/15/18 1142  TempSrc: Oral  PainSc: 0-No pain      Patients Stated Pain Goal: 6 (46/88/[id number])  Complications: No apparent anesthesia complications

## 2018-06-15 NOTE — Progress Notes (Signed)
Subjective: Patient has no complaints.  Objective: Vital signs in last 24 hours: Temp:  [97.4 F (36.3 C)-98.4 F (36.9 C)] 97.4 F (36.3 C) (07/31 0506) Pulse Rate:  [54-109] 54 (07/31 0506) Resp:  [18] 18 (07/31 0506) BP: (106-148)/(63-79) 106/63 (07/31 0506) SpO2:  [98 %-100 %] 99 % (07/31 0506) Weight:  [183 lb (83 kg)] 183 lb (83 kg) (07/30 1121) Last BM Date: 06/14/18  Intake/Output from previous day: 07/30 0701 - 07/31 0700 In: 767.5 [I.V.:767.5] Out: 600 [Urine:600] Intake/Output this shift: Total I/O In: 0  Out: 250 [Urine:250]  General appearance: alert, cooperative and no distress GI: soft, non-tender; bowel sounds normal; no masses,  no organomegaly  Lab Results:  Recent Labs    06/14/18 1716 06/15/18 0422  WBC 9.2 5.5  HGB 11.8* 11.1*  HCT 35.3* 33.1*  PLT 231 202   BMET Recent Labs    06/14/18 1716 06/14/18 1754 06/15/18 0422  NA 137  --  138  K 4.2  --  4.2  CL 104  --  106  CO2 27  --  26  GLUCOSE 131*  --  98  BUN 10  --  8  CREATININE 0.91 0.80 0.83  CALCIUM 9.5  --  9.3   PT/INR No results for input(s): LABPROT, INR in the last 72 hours.  Studies/Results: Mr 3d Recon At Scanner  Result Date: 06/14/2018 CLINICAL DATA:  Cholelithiasis, abnormal LFTs, dilated CBD EXAM: MRI ABDOMEN WITHOUT AND WITH CONTRAST (INCLUDING MRCP) TECHNIQUE: Multiplanar multisequence MR imaging of the abdomen was performed both before and after the administration of intravenous contrast. Heavily T2-weighted images of the biliary and pancreatic ducts were obtained, and three-dimensional MRCP images were rendered by post processing. CONTRAST:  73mL MULTIHANCE GADOBENATE DIMEGLUMINE 529 MG/ML IV SOLN COMPARISON:  [place] CT abdomen/pelvis dated 06/01/2018 FINDINGS: Lower chest: Lung bases are clear. Hepatobiliary: Liver is within normal limits. No morphologic findings of cirrhosis. No suspicious/enhancing hepatic lesions. No hepatic steatosis. Layering small  gallstones with gallbladder sludge (series 26/image 40). Distended gallbladder. No associated inflammatory changes. Mild intrahepatic ductal dilatation. Moderate extrahepatic ductal dilatation, measuring 15 mm. Associated 11 mm filling defect in the distal CBD at the ampulla (series 3/image 22). This was not conspicuous on prior CT. While an ampullary lesion is possible, the appearance favors a distal CBD stone. Pancreas:  Within normal limits. Spleen:  Within normal limits. Adrenals/Urinary Tract:  Adrenal glands are within normal limits. Kidneys are within normal limits.  No hydronephrosis. Stomach/Bowel: Stomach is notable for a tiny hiatal hernia. Visualized bowel is unremarkable. Vascular/Lymphatic:  No evidence of abdominal aortic aneurysm. No suspicious abdominal lymphadenopathy. Other:  No abdominal ascites. Musculoskeletal: No focal osseous lesions. IMPRESSION: Cholelithiasis, without associated findings to suggest acute cholecystitis. Intrahepatic and extrahepatic ductal dilatation. Dilated common duct, measuring 15 mm. Associated 11 mm filling defect in the distal CBD at the ampulla, favoring a distal CBD stone. ERCP is suggested. Electronically Signed   By: Julian Hy M.D.   On: 06/14/2018 20:34   Mr Abdomen Mrcp Moise Boring Contast  Result Date: 06/14/2018 CLINICAL DATA:  Cholelithiasis, abnormal LFTs, dilated CBD EXAM: MRI ABDOMEN WITHOUT AND WITH CONTRAST (INCLUDING MRCP) TECHNIQUE: Multiplanar multisequence MR imaging of the abdomen was performed both before and after the administration of intravenous contrast. Heavily T2-weighted images of the biliary and pancreatic ducts were obtained, and three-dimensional MRCP images were rendered by post processing. CONTRAST:  32mL MULTIHANCE GADOBENATE DIMEGLUMINE 529 MG/ML IV SOLN COMPARISON:  [place] CT abdomen/pelvis dated 06/01/2018  FINDINGS: Lower chest: Lung bases are clear. Hepatobiliary: Liver is within normal limits. No morphologic findings of  cirrhosis. No suspicious/enhancing hepatic lesions. No hepatic steatosis. Layering small gallstones with gallbladder sludge (series 26/image 40). Distended gallbladder. No associated inflammatory changes. Mild intrahepatic ductal dilatation. Moderate extrahepatic ductal dilatation, measuring 15 mm. Associated 11 mm filling defect in the distal CBD at the ampulla (series 3/image 22). This was not conspicuous on prior CT. While an ampullary lesion is possible, the appearance favors a distal CBD stone. Pancreas:  Within normal limits. Spleen:  Within normal limits. Adrenals/Urinary Tract:  Adrenal glands are within normal limits. Kidneys are within normal limits.  No hydronephrosis. Stomach/Bowel: Stomach is notable for a tiny hiatal hernia. Visualized bowel is unremarkable. Vascular/Lymphatic:  No evidence of abdominal aortic aneurysm. No suspicious abdominal lymphadenopathy. Other:  No abdominal ascites. Musculoskeletal: No focal osseous lesions. IMPRESSION: Cholelithiasis, without associated findings to suggest acute cholecystitis. Intrahepatic and extrahepatic ductal dilatation. Dilated common duct, measuring 15 mm. Associated 11 mm filling defect in the distal CBD at the ampulla, favoring a distal CBD stone. ERCP is suggested. Electronically Signed   By: Julian Hy M.D.   On: 06/14/2018 20:34    Anti-infectives: Anti-infectives (From admission, onward)   None      Assessment/Plan: Impression: Cholelithiasis, choledocholithiasis Plan: Patient to undergo ERCP with stone extraction today.  We will schedule for laparoscopic cholecystectomy, possible open on 06/17/2018.  The risks and benefits of the procedure including bleeding, infection, hepatobiliary injury, and the possibility of an open procedure were fully explained to the patient, who gave informed consent.  LOS: 1 day    Aviva Signs 06/15/2018

## 2018-06-15 NOTE — Anesthesia Postprocedure Evaluation (Signed)
Anesthesia Post Note  Patient: PRINTICE HELLMER  Procedure(s) Performed: ENDOSCOPIC RETROGRADE CHOLANGIOPANCREATOGRAPHY (ERCP) (N/A Esophagus) BILIARY STENT PLACEMENT (N/A Esophagus) SPHINCTEROTOMY (N/A Esophagus)  Patient location during evaluation: PACU Anesthesia Type: General Level of consciousness: awake and alert and oriented Pain management: pain level controlled Vital Signs Assessment: post-procedure vital signs reviewed and stable Respiratory status: spontaneous breathing Cardiovascular status: blood pressure returned to baseline Postop Assessment: no apparent nausea or vomiting Anesthetic complications: no     Last Vitals:  Vitals:   06/15/18 1505 06/15/18 1515  BP: 131/64 123/63  Pulse: 67 64  Resp: 12 20  Temp: 36.7 C   SpO2: 96% 99%    Last Pain:  Vitals:   06/15/18 1505  TempSrc:   PainSc: Asleep                 Omara Alcon

## 2018-06-15 NOTE — Anesthesia Procedure Notes (Signed)
Procedure Name: Intubation Date/Time: 06/15/2018 1:52 PM Performed by: Ollen Bowl, CRNA Pre-anesthesia Checklist: Patient identified, Patient being monitored, Timeout performed, Emergency Drugs available and Suction available Patient Re-evaluated:Patient Re-evaluated prior to induction Oxygen Delivery Method: Circle system utilized Preoxygenation: Pre-oxygenation with 100% oxygen Induction Type: IV induction Ventilation: Mask ventilation without difficulty Laryngoscope Size: Mac and 3 Grade View: Grade I Tube type: Oral Tube size: 7.0 mm Number of attempts: 1 Airway Equipment and Method: Stylet Placement Confirmation: ETT inserted through vocal cords under direct vision,  positive ETCO2 and breath sounds checked- equal and bilateral Secured at: 23 cm Tube secured with: Tape Dental Injury: Teeth and Oropharynx as per pre-operative assessment

## 2018-06-15 NOTE — Consult Note (Signed)
Reason for Consult: CBD stone Referring Physician:   RENDON HOWELL is an 77 y.o. male.  HPI: Admitted yesterday after seeing Dr. Arnoldo Morale for CBD stone in his office.. Had been having rt lower quadrant pain. Denies upper abdominal pain. Orginally seen by Arsenio Katz NP and labs and CT ordered. CT on 06/01/2018 revealed New generalized biliary ductal dilatation, and the CBD is dilated measuring 11-12 MM at the porta hepatis.  The CBD tapers in the distal pancrease and at the [facility] with no internal filling defect evident. Small gallstones in the gallbladder fundus.   States Dr Manuella Ghazi tried to refer him to [facility] but he refused. He went to Dr. Arnoldo Morale on his own.  Underwent an MRI yesterday for abnormal LFTs, cholelithiasis, dilated CBD which revealed: Cholelithiasis, without associated findings to suggest acute cholecystitis. Intrahepatic and extrahepatic ductal dilatation. Dilated common duct, measuring 15 mm. Associated 11 mm filling defect in the distal CBD at the ampulla, favoring a distal CBD stone. ERCP is suggested. 06/14/2018 AST 127, ALT 79, ALP 909. Total bili 3.7.  Denies any recent nausea or vomiting. No fever. BMs ar normal.  Has been on a Keto diet but stopped 14 days ago. Weight loss was intentional.   05/25/2018 total bili 4.3, ALP 648, AST 65, ALT 66   Past Medical History:  Diagnosis Date  . Chest pain, unspecified   . Colon cancer ([place]) 1999  . GERD (gastroesophageal reflux disease)   . History of gout   . History of herniorrhaphy   . Hypercholesteremia   . Hypertension     Past Surgical History:  Procedure Laterality Date  . AMPUTATION OF REPLICATED TOES Right    big toe  . APPENDECTOMY    . CARDIAC CATHETERIZATION     stents x2  . COLONOSCOPY N/A 11/21/2014   Procedure: COLONOSCOPY;  Surgeon: Rogene Houston, MD;  Location: [facility] ENDO SUITE;  Service: Endoscopy;  Laterality: N/A;  830  . HERNIA REPAIR     Incisional hernia  . INGUINAL HERNIA REPAIR Right 09/02/2016    Procedure: HERNIA REPAIR INGUINAL ADULT WITH MESH;  Surgeon: Aviva Signs, MD;  Location: [facility] ORS;  Service: General;  Laterality: Right;  . Left foot surgery Left    lawn mower accidnet  . PARTIAL COLECTOMY    . Right great toe surgery    . TONSILLECTOMY      History reviewed. No pertinent family history.  Social History:  reports that he quit smoking about 8 years ago. His smoking use included cigarettes. He has a 30.00 pack-year smoking history. His smokeless tobacco use includes snuff. He reports that he drinks alcohol. He reports that he does not use drugs.  Allergies:  Allergies  Allergen Reactions  . Oxycodone   . Oxycontin [Oxycodone Hcl]     Hallucinations     Medications: I have reviewed the patient's current medications.  Results for orders placed or performed during the hospital encounter of 06/14/18 (from the past 48 hour(s))  Comprehensive metabolic panel     Status: Abnormal   Collection Time: 06/14/18  5:16 PM  Result Value Ref Range   Sodium 137 135 - 145 mmol/L   Potassium 4.2 3.5 - 5.1 mmol/L   Chloride 104 98 - 111 mmol/L   CO2 27 22 - 32 mmol/L   Glucose, Bld 131 (H) 70 - 99 mg/dL   BUN 10 8 - 23 mg/dL   Creatinine, Ser 0.91 0.61 - 1.24 mg/dL   Calcium 9.5 8.9 -  10.3 mg/dL   Total Protein 6.4 (L) 6.5 - 8.1 g/dL   Albumin 3.1 (L) 3.5 - 5.0 g/dL   AST 127 (H) 15 - 41 U/L   ALT 79 (H) 0 - 44 U/L   Alkaline Phosphatase 909 (H) 38 - 126 U/L   Total Bilirubin 3.7 (H) 0.3 - 1.2 mg/dL   GFR calc non Af Amer >60 >60 mL/min   GFR calc Af Amer >60 >60 mL/min    Comment: (NOTE) The eGFR has been calculated using the CKD EPI equation. This calculation has not been validated in all clinical situations. eGFR's persistently <60 mL/min signify possible Chronic Kidney Disease.    Anion gap 6 5 - 15    Comment: Performed at [facility], [address], [place], [place] [address]  Magnesium     Status: None   Collection Time: 06/14/18  5:16 PM  Result Value Ref  Range   Magnesium 1.8 1.7 - 2.4 mg/dL    Comment: Performed at [facility], [address] [place], [place] [address]  Phosphorus     Status: None   Collection Time: 06/14/18  5:16 PM  Result Value Ref Range   Phosphorus 2.6 2.5 - 4.6 mg/dL    Comment: Performed at [facility], [address] [place], [place] [address]  CBC WITH DIFFERENTIAL     Status: Abnormal   Collection Time: 06/14/18  5:16 PM  Result Value Ref Range   WBC 9.2 4.0 - 10.5 K/uL   RBC 3.57 (L) 4.22 - 5.81 MIL/uL   Hemoglobin 11.8 (L) 13.0 - 17.0 g/dL   HCT 35.3 (L) 39.0 - 52.0 %   MCV 98.9 78.0 - 100.0 fL   MCH 33.1 26.0 - 34.0 pg   MCHC 33.4 30.0 - 36.0 g/dL   RDW 14.2 11.5 - 15.5 %   Platelets 231 150 - 400 K/uL   Neutrophils Relative % 75 %   Neutro Abs 7.0 1.7 - 7.7 K/uL   Lymphocytes Relative 12 %   Lymphs Abs 1.1 0.7 - 4.0 K/uL   Monocytes Relative 10 %   Monocytes Absolute 0.9 0.1 - 1.0 K/uL   Eosinophils Relative 2 %   Eosinophils Absolute 0.1 0.0 - 0.7 K/uL   Basophils Relative 1 %   Basophils Absolute 0.1 0.0 - 0.1 K/uL    Comment: Performed at [facility], [address], [place],  [address]  I-STAT creatinine     Status: None   Collection Time: 06/14/18  5:54 PM  Result Value Ref Range   Creatinine, Ser 0.80 0.61 - 1.24 mg/dL  Basic metabolic panel     Status: None   Collection Time: 06/15/18  4:22 AM  Result Value Ref Range   Sodium 138 135 - 145 mmol/L   Potassium 4.2 3.5 - 5.1 mmol/L   Chloride 106 98 - 111 mmol/L   CO2 26 22 - 32 mmol/L   Glucose, Bld 98 70 - 99 mg/dL   BUN 8 8 - 23 mg/dL   Creatinine, Ser 0.83 0.61 - 1.24 mg/dL   Calcium 9.3 8.9 - 10.3 mg/dL   GFR calc non Af Amer >60 >60 mL/min   GFR calc Af Amer >60 >60 mL/min    Comment: (NOTE) The eGFR has been calculated using the CKD EPI equation. This calculation has not been validated in all clinical situations. eGFR's persistently <60 mL/min signify possible Chronic Kidney Disease.    Anion gap 6 5 - 15  Comment: Performed at [facility], [address] [place], [place] [address]  CBC     Status: Abnormal   Collection Time: 06/15/18  4:22 AM  Result Value Ref Range   WBC 5.5 4.0 - 10.5 K/uL   RBC 3.36 (L) 4.22 - 5.81 MIL/uL   Hemoglobin 11.1 (L) 13.0 - 17.0 g/dL   HCT 33.1 (L) 39.0 - 52.0 %   MCV 98.5 78.0 - 100.0 fL   MCH 33.0 26.0 - 34.0 pg   MCHC 33.5 30.0 - 36.0 g/dL   RDW 14.2 11.5 - 15.5 %   Platelets 202 150 - 400 K/uL    Comment: Performed at [facility], [address] [place], [place] [address]    Mr 3d Recon At Scanner  Result Date: 06/14/2018 CLINICAL DATA:  Cholelithiasis, abnormal LFTs, dilated CBD EXAM: MRI ABDOMEN WITHOUT AND WITH CONTRAST (INCLUDING MRCP) TECHNIQUE: Multiplanar multisequence MR imaging of the abdomen was performed both before and after the administration of intravenous contrast. Heavily T2-weighted images of the biliary and pancreatic ducts were obtained, and three-dimensional MRCP images were rendered by post processing. CONTRAST:  62m MULTIHANCE GADOBENATE DIMEGLUMINE 529 MG/ML IV SOLN COMPARISON:  [facility] CT abdomen/pelvis dated 06/01/2018 FINDINGS: Lower chest: Lung bases are clear. Hepatobiliary: Liver is within normal limits. No morphologic findings of cirrhosis. No suspicious/enhancing hepatic lesions. No hepatic steatosis. Layering small gallstones with gallbladder sludge (series 26/image 40). Distended gallbladder. No associated inflammatory changes. Mild intrahepatic ductal dilatation. Moderate extrahepatic ductal dilatation, measuring 15 mm. Associated 11 mm filling defect in the distal CBD at the ampulla (series 3/image 22). This was not conspicuous on prior CT. While an ampullary lesion is possible, the appearance favors a distal CBD stone. Pancreas:  Within normal limits. Spleen:  Within normal limits. Adrenals/Urinary Tract:  Adrenal glands are within normal limits. Kidneys are within normal limits.  No hydronephrosis. Stomach/Bowel: Stomach is  notable for a tiny hiatal hernia. Visualized bowel is unremarkable. Vascular/Lymphatic:  No evidence of abdominal aortic aneurysm. No suspicious abdominal lymphadenopathy. Other:  No abdominal ascites. Musculoskeletal: No focal osseous lesions. IMPRESSION: Cholelithiasis, without associated findings to suggest acute cholecystitis. Intrahepatic and extrahepatic ductal dilatation. Dilated common duct, measuring 15 mm. Associated 11 mm filling defect in the distal CBD at the ampulla, favoring a distal CBD stone. ERCP is suggested. Electronically Signed   By: SJulian HyM.D.   On: 06/14/2018 20:34   Mr Abdomen Mrcp WMoise BoringContast  Result Date: 06/14/2018 CLINICAL DATA:  Cholelithiasis, abnormal LFTs, dilated CBD EXAM: MRI ABDOMEN WITHOUT AND WITH CONTRAST (INCLUDING MRCP) TECHNIQUE: Multiplanar multisequence MR imaging of the abdomen was performed both before and after the administration of intravenous contrast. Heavily T2-weighted images of the biliary and pancreatic ducts were obtained, and three-dimensional MRCP images were rendered by post processing. CONTRAST:  134mMULTIHANCE GADOBENATE DIMEGLUMINE 529 MG/ML IV SOLN COMPARISON:  [facility] CT abdomen/pelvis dated 06/01/2018 FINDINGS: Lower chest: Lung bases are clear. Hepatobiliary: Liver is within normal limits. No morphologic findings of cirrhosis. No suspicious/enhancing hepatic lesions. No hepatic steatosis. Layering small gallstones with gallbladder sludge (series 26/image 40). Distended gallbladder. No associated inflammatory changes. Mild intrahepatic ductal dilatation. Moderate extrahepatic ductal dilatation, measuring 15 mm. Associated 11 mm filling defect in the distal CBD at the ampulla (series 3/image 22). This was not conspicuous on prior CT. While an ampullary lesion is possible, the appearance favors a distal CBD stone. Pancreas:  Within normal limits. Spleen:  Within normal limits. Adrenals/Urinary Tract:  Adrenal glands are within normal  limits. Kidneys are within normal limits.  No hydronephrosis. Stomach/Bowel: Stomach is notable for a tiny hiatal hernia. Visualized bowel is unremarkable. Vascular/Lymphatic:  No evidence of abdominal aortic aneurysm. No suspicious abdominal lymphadenopathy. Other:  No abdominal ascites. Musculoskeletal: No focal osseous lesions. IMPRESSION: Cholelithiasis, without associated findings to suggest acute cholecystitis. Intrahepatic and extrahepatic ductal dilatation. Dilated common duct, measuring 15 mm. Associated 11 mm filling defect in the distal CBD at the ampulla, favoring a distal CBD stone. ERCP is suggested. Electronically Signed   By: Julian Hy M.D.   On: 06/14/2018 20:34    ROS Blood pressure 106/63, pulse (!) 54, temperature (!) 97.4 F (36.3 C), temperature source Oral, resp. rate 18, height 5' 7"  (1.702 m), weight 183 lb (83 kg), SpO2 99 %. Physical Exam Alert and oriented. Skin warm and dry. Oral mucosa is moist.   . Sclera anicteric, conjunctivae is pink. Thyroid not enlarged. No cervical lymphadenopathy. Lungs clear. Heart regular rate and rhythm.  Abdomen is soft. Bowel sounds are positive. No hepatomegaly. No abdominal masses felt. Slight tenderness RLQ.Marland Kitchen  No edema to lower extremities.  .     Assessment/Plan: CBD stone. Dr. Laural Golden is aware. Keep patient NPO  Butch Penny 06/15/2018, 8:38 AM

## 2018-06-15 NOTE — Progress Notes (Signed)
PROGRESS NOTE    Brandon [name]  DQQ:[id number] DOB: Jun 28, 1941 DOA: 06/14/2018 PCP: Monico Blitz, MD   Brief Narrative:   Brandon [name] is a 77 y.o. male 77 y/o with PMH significant for colon cancer s/p descending colectomy (in remission), GERD, Gout, HLD and HTN; who came to ED as direct admission from Dr, Arnoldo Morale office due to concerns for cholelithiasis with choledocholithiasis.  He was noted to have intermittent episodes of abdominal pain and was noted to be jaundiced as well.  MRCP reveals common bile duct stone with elevated liver enzymes.    Assessment & Plan:   Principal Problem:   Cholelithiasis with choledocholithiasis Active Problems:   Colon cancer ([place])   HTN (hypertension)   GERD (gastroesophageal reflux disease)   Jaundice   HLD (hyperlipidemia)   1. Jaundice secondary to cholelithiasis with choledocholithiasis.  GI consulted for ERCP due to findings of common bile duct obstruction on MRCP.  General surgery plans for cholecystectomy shortly afterwards.  N.p.o. for now with PRN antiemetics and analgesics.  Continue IV fluid.  Heparin changed to SCDs. 2. Hypertension.  Stable and well-controlled.  Not currently on any medications. 3. GERD.  Continue PPI. 4. Dyslipidemia.  Hold statins in the setting of elevated LFTs. 5. History of colon cancer-currently in remission. 6. History of CAD-nonobstructive.  Continue aspirin and resume statins when liver function returns back to normal.   DVT prophylaxis: Heparin changed to SCDs Code Status: Full Family Communication: None Disposition Plan: GI consultation for ERCP with plans for cholecystectomy subsequently.   Consultants:   General Surgery  GI  Procedures:   None  Antimicrobials:   None   Subjective: Patient seen and evaluated today with no new acute complaints or concerns. No acute concerns or events noted overnight.  He denies any significant abdominal pain or nausea this  morning.  Objective: Vitals:   06/14/18 1121 06/14/18 2004 06/14/18 2115 06/15/18 0506  BP: (!) 148/79  121/69 106/63  Pulse: (!) 109  60 (!) 54  Resp: 18  18 18   Temp: 98.2 F (36.8 C)  98.4 F (36.9 C) (!) 97.4 F (36.3 C)  TempSrc: Oral  Oral Oral  SpO2: 100% 98% 98% 99%  Weight: 83 kg (183 lb)     Height: 5\' 7"  (1.702 m)       Intake/Output Summary (Last 24 hours) at 06/15/2018 0859 Last data filed at 06/15/2018 0710 Gross per 24 hour  Intake 767.5 ml  Output 850 ml  Net -82.5 ml   Filed Weights   06/14/18 1121  Weight: 83 kg (183 lb)    Examination:  General exam: Appears calm and comfortable; hard of hearing  Respiratory system: Clear to auscultation. Respiratory effort normal. Cardiovascular system: S1 & S2 heard, RRR. No JVD, murmurs, rubs, gallops or clicks. No pedal edema. Gastrointestinal system: Abdomen is nondistended, soft and nontender. No organomegaly or masses felt. Normal bowel sounds heard. Central nervous system: Alert and oriented. No focal neurological deficits. Extremities: Symmetric 5 x 5 power. Skin: No rashes, lesions or ulcers. Jaundiced.    Data Reviewed: I have personally reviewed following labs and imaging studies  CBC: Recent Labs  Lab 06/14/18 1716 06/15/18 0422  WBC 9.2 5.5  NEUTROABS 7.0  --   HGB 11.8* 11.1*  HCT 35.3* 33.1*  MCV 98.9 98.5  PLT 231 194   Basic Metabolic Panel: Recent Labs  Lab 06/14/18 1716 06/14/18 1754 06/15/18 0422  NA 137  --  138  K 4.2  --  4.2  CL 104  --  106  CO2 27  --  26  GLUCOSE 131*  --  98  BUN 10  --  8  CREATININE 0.91 0.80 0.83  CALCIUM 9.5  --  9.3  MG 1.8  --   --   PHOS 2.6  --   --    GFR: Estimated Creatinine Clearance: 79.3 mL/min (by C-G formula based on SCr of 0.83 mg/dL). Liver Function Tests: Recent Labs  Lab 06/14/18 1716  AST 127*  ALT 79*  ALKPHOS 909*  BILITOT 3.7*  PROT 6.4*  ALBUMIN 3.1*   No results for input(s): LIPASE, AMYLASE in the last 168  hours. No results for input(s): AMMONIA in the last 168 hours. Coagulation Profile: No results for input(s): INR, PROTIME in the last 168 hours. Cardiac Enzymes: No results for input(s): CKTOTAL, CKMB, CKMBINDEX, TROPONINI in the last 168 hours. BNP (last 3 results) No results for input(s): PROBNP in the last 8760 hours. HbA1C: No results for input(s): HGBA1C in the last 72 hours. CBG: No results for input(s): GLUCAP in the last 168 hours. Lipid Profile: No results for input(s): CHOL, HDL, LDLCALC, TRIG, CHOLHDL, LDLDIRECT in the last 72 hours. Thyroid Function Tests: No results for input(s): TSH, T4TOTAL, FREET4, T3FREE, THYROIDAB in the last 72 hours. Anemia Panel: No results for input(s): VITAMINB12, FOLATE, FERRITIN, TIBC, IRON, RETICCTPCT in the last 72 hours. Sepsis Labs: No results for input(s): PROCALCITON, LATICACIDVEN in the last 168 hours.  No results found for this or any previous visit (from the past 240 hour(s)).       Radiology Studies: Mr 3d Recon At Scanner  Result Date: 06/14/2018 CLINICAL DATA:  Cholelithiasis, abnormal LFTs, dilated CBD EXAM: MRI ABDOMEN WITHOUT AND WITH CONTRAST (INCLUDING MRCP) TECHNIQUE: Multiplanar multisequence MR imaging of the abdomen was performed both before and after the administration of intravenous contrast. Heavily T2-weighted images of the biliary and pancreatic ducts were obtained, and three-dimensional MRCP images were rendered by post processing. CONTRAST:  38mL MULTIHANCE GADOBENATE DIMEGLUMINE 529 MG/ML IV SOLN COMPARISON:  [facility] CT abdomen/pelvis dated 06/01/2018 FINDINGS: Lower chest: Lung bases are clear. Hepatobiliary: Liver is within normal limits. No morphologic findings of cirrhosis. No suspicious/enhancing hepatic lesions. No hepatic steatosis. Layering small gallstones with gallbladder sludge (series 26/image 40). Distended gallbladder. No associated inflammatory changes. Mild intrahepatic ductal dilatation. Moderate  extrahepatic ductal dilatation, measuring 15 mm. Associated 11 mm filling defect in the distal CBD at the ampulla (series 3/image 22). This was not conspicuous on prior CT. While an ampullary lesion is possible, the appearance favors a distal CBD stone. Pancreas:  Within normal limits. Spleen:  Within normal limits. Adrenals/Urinary Tract:  Adrenal glands are within normal limits. Kidneys are within normal limits.  No hydronephrosis. Stomach/Bowel: Stomach is notable for a tiny hiatal hernia. Visualized bowel is unremarkable. Vascular/Lymphatic:  No evidence of abdominal aortic aneurysm. No suspicious abdominal lymphadenopathy. Other:  No abdominal ascites. Musculoskeletal: No focal osseous lesions. IMPRESSION: Cholelithiasis, without associated findings to suggest acute cholecystitis. Intrahepatic and extrahepatic ductal dilatation. Dilated common duct, measuring 15 mm. Associated 11 mm filling defect in the distal CBD at the ampulla, favoring a distal CBD stone. ERCP is suggested. Electronically Signed   By: Julian Hy M.D.   On: 06/14/2018 20:34   Mr Abdomen Mrcp Moise Boring Contast  Result Date: 06/14/2018 CLINICAL DATA:  Cholelithiasis, abnormal LFTs, dilated CBD EXAM: MRI ABDOMEN WITHOUT AND WITH CONTRAST (INCLUDING MRCP) TECHNIQUE: Multiplanar multisequence MR imaging of the abdomen  was performed both before and after the administration of intravenous contrast. Heavily T2-weighted images of the biliary and pancreatic ducts were obtained, and three-dimensional MRCP images were rendered by post processing. CONTRAST:  30mL MULTIHANCE GADOBENATE DIMEGLUMINE 529 MG/ML IV SOLN COMPARISON:  [facility] CT abdomen/pelvis dated 06/01/2018 FINDINGS: Lower chest: Lung bases are clear. Hepatobiliary: Liver is within normal limits. No morphologic findings of cirrhosis. No suspicious/enhancing hepatic lesions. No hepatic steatosis. Layering small gallstones with gallbladder sludge (series 26/image 40). Distended  gallbladder. No associated inflammatory changes. Mild intrahepatic ductal dilatation. Moderate extrahepatic ductal dilatation, measuring 15 mm. Associated 11 mm filling defect in the distal CBD at the ampulla (series 3/image 22). This was not conspicuous on prior CT. While an ampullary lesion is possible, the appearance favors a distal CBD stone. Pancreas:  Within normal limits. Spleen:  Within normal limits. Adrenals/Urinary Tract:  Adrenal glands are within normal limits. Kidneys are within normal limits.  No hydronephrosis. Stomach/Bowel: Stomach is notable for a tiny hiatal hernia. Visualized bowel is unremarkable. Vascular/Lymphatic:  No evidence of abdominal aortic aneurysm. No suspicious abdominal lymphadenopathy. Other:  No abdominal ascites. Musculoskeletal: No focal osseous lesions. IMPRESSION: Cholelithiasis, without associated findings to suggest acute cholecystitis. Intrahepatic and extrahepatic ductal dilatation. Dilated common duct, measuring 15 mm. Associated 11 mm filling defect in the distal CBD at the ampulla, favoring a distal CBD stone. ERCP is suggested. Electronically Signed   By: Julian Hy M.D.   On: 06/14/2018 20:34        Scheduled Meds: . aspirin  325 mg Oral Daily  . heparin  5,000 Units Subcutaneous Q8H  . pantoprazole  40 mg Oral Daily   Continuous Infusions: . sodium chloride 75 mL/hr at 06/15/18 0518     LOS: 1 day    Time spent: 30 minutes    [place] Darleen Crocker, DO [facility] Pager [phone]  If 7PM-7AM, please contact night-coverage [email or web address] Password [email or web address] 06/15/2018, 8:59 AM

## 2018-06-15 NOTE — Progress Notes (Signed)
Brief ERCP note:  Normal ampulla of Vater. Dilated CBD and CHD with large stone impacted in the distal CBD. Stone could not be dislodged moved up or call with small or large Dormia basket. 10 French 7 cm long plastic biliary stent placed for decompression. Patient tolerated procedure well.

## 2018-06-15 NOTE — Anesthesia Preprocedure Evaluation (Signed)
Anesthesia Evaluation  Patient identified by MRN, date of birth, ID band Patient awake    Reviewed: Allergy & Precautions, H&P , NPO status , Patient's Chart, lab work & pertinent test results, reviewed documented beta blocker date and time   Airway Mallampati: II  TM Distance: >3 FB Neck ROM: full    Dental no notable dental hx. (+) Teeth Intact, Dental Advidsory Given   Pulmonary neg pulmonary ROS, former smoker,    Pulmonary exam normal breath sounds clear to auscultation       Cardiovascular Exercise Tolerance: Good hypertension, negative cardio ROS   Rhythm:regular Rate:Normal     Neuro/Psych negative neurological ROS  negative psych ROS   GI/Hepatic negative GI ROS, Neg liver ROS, GERD  ,  Endo/Other  negative endocrine ROS  Renal/GU negative Renal ROS  negative genitourinary   Musculoskeletal   Abdominal   Peds  Hematology negative hematology ROS (+)   Anesthesia Other Findings Cholelithiasis,  Two stents, 2010 asymptomatic since.  No NTG use Prone precautions  Reproductive/Obstetrics negative OB ROS                             Anesthesia Physical Anesthesia Plan  ASA: III  Anesthesia Plan: General   Post-op Pain Management:    Induction:   PONV Risk Score and Plan:   Airway Management Planned:   Additional Equipment:   Intra-op Plan:   Post-operative Plan:   Informed Consent: I have reviewed the patients History and Physical, chart, labs and discussed the procedure including the risks, benefits and alternatives for the proposed anesthesia with the patient or authorized representative who has indicated his/her understanding and acceptance.   Dental Advisory Given  Plan Discussed with: CRNA and Anesthesiologist  Anesthesia Plan Comments:         Anesthesia Quick Evaluation

## 2018-06-15 NOTE — Op Note (Signed)
[facility] Patient Name: Brandon [name] Procedure Date: 06/15/2018 1:15 PM MRN: [id number] Date of Birth: Jan 29, 1941 Attending MD: Hildred Laser , MD CSN: [id number] Age: 77 Admit Type: Inpatient Procedure:                ERCP Indications:              Common bile duct stone(s) Providers:                Hildred Laser, MD, Hinton Rao, RN, Nelma Rothman,                            Technician Referring MD:             Heath Lark, DO Medicines:                General Anesthesia Complications:            No immediate complications. Estimated Blood Loss:     Estimated blood loss: none. Procedure:                Pre-Anesthesia Assessment:                           - Prior to the procedure, a History and Physical                            was performed, and patient medications and                            allergies were reviewed. The patient's tolerance of                            previous anesthesia was also reviewed. The risks                            and benefits of the procedure and the sedation                            options and risks were discussed with the patient.                            All questions were answered, and informed consent                            was obtained. Prior Anticoagulants: The patient                            last took heparin 1 day prior to the procedure and                            last took aspirin on the day of the procedure. ASA                            Grade Assessment: III - A patient with severe  systemic disease. After reviewing the risks and                            benefits, the patient was deemed in satisfactory                            condition to undergo the procedure.                           After obtaining informed consent, the scope was                            passed under direct vision. Throughout the                            procedure, the patient's blood pressure, pulse, and                             oxygen saturations were monitored continuously. The                            [id number] ([id number]) was introduced through the                            mouth, and used to inject contrast into and used to                            inject contrast into the bile duct. The ERCP was                            accomplished without difficulty. The patient                            tolerated the procedure well. Scope In: 2:05:46 PM Scope Out: 2:49:15 PM Total Procedure Duration: 0 hours 43 minutes 29 seconds  Findings:      The scout film was normal. The esophagus was successfully intubated       under direct vision. The scope was advanced to a normal major papilla in       the descending duodenum without detailed examination of the pharynx,       larynx and associated structures, and upper GI tract. The upper GI tract       was grossly normal except non-critical Schatzki's ring. The bile duct       was deeply cannulated with the Hydratome sphincterotome. Contrast was       injected. I personally interpreted the bile duct images. Ductal flow of       contrast was adequate. Image quality was excellent. Contrast extended to       the main bile duct. Contrast extended to the bifurcation. Contrast       extended to the hepatic ducts. The common bile duct and common hepatic       duct were moderately dilated and diffusely dilated, with a stone causing       an obstruction. A 10 mm biliary sphincterotomy was made with a braided  Hydratome sphincterotome using ERBE electrocautery. There was no       post-sphincterotomy bleeding. To discover objects, the biliary tree was       swept with a basket starting at the bifurcation. Sludge was swept from       the duct. Large stone was impacted in distal CBD just above ampulla and       could not be removed. One 10 Fr by 7 cm plastic stent with two internal       flaps was placed 6 cm into the common bile duct. Bile flowed  through the       stent. The stent was in good position. Impression:               - The common bile duct and common hepatic duct were                            moderately dilated, with a stone causing an                            obstruction.                           - A biliary sphincterotomy was performed.                           - The biliary tree was swept and sludge was found.                           - One plastic stent was placed into the common bile                            duct. Moderate Sedation:      Per Anesthesia Care Recommendation:           - Return patient to hospital ward for ongoing care.                           - Avoid aspirin and nonsteroidal anti-inflammatory                            medicines for 3 days.                           - Clear liquid diet today.                           - Cholecystectomy as planned.                           - Repeat ERCP in 6 to 8 weeks. Procedure Code(s):        --- Professional ---                           [phone], Endoscopic retrograde                            cholangiopancreatography (ERCP); with placement of  endoscopic stent into biliary or pancreatic duct,                            including pre- and post-dilation and guide wire                            passage, when performed, including sphincterotomy,                            when performed, each stent                           [id number], Endoscopic retrograde                            cholangiopancreatography (ERCP); with removal of                            calculi/debris from biliary/pancreatic duct(s) Diagnosis Code(s):        --- Professional ---                           K80.51, Calculus of bile duct without cholangitis                            or cholecystitis with obstruction CPT copyright 2017 [employer]. All rights reserved. The codes documented in this report are preliminary and upon coder review may   be revised to meet current compliance requirements. Hildred Laser, MD Hildred Laser, MD 06/15/2018 4:21:34 PM This report has been signed electronically. Number of Addenda: 0

## 2018-06-16 DIAGNOSIS — K8070 Calculus of gallbladder and bile duct without cholecystitis without obstruction: Secondary | ICD-10-CM

## 2018-06-16 LAB — CBC
HEMATOCRIT: 33.2 % — AB (ref 39.0–52.0)
HEMOGLOBIN: 11.2 g/dL — AB (ref 13.0–17.0)
MCH: 33.1 pg (ref 26.0–34.0)
MCHC: 33.7 g/dL (ref 30.0–36.0)
MCV: 98.2 fL (ref 78.0–100.0)
Platelets: 191 10*3/uL (ref 150–400)
RBC: 3.38 MIL/uL — ABNORMAL LOW (ref 4.22–5.81)
RDW: 14 % (ref 11.5–15.5)
WBC: 6.9 10*3/uL (ref 4.0–10.5)

## 2018-06-16 LAB — COMPREHENSIVE METABOLIC PANEL
ALK PHOS: 602 U/L — AB (ref 38–126)
ALT: 49 U/L — ABNORMAL HIGH (ref 0–44)
AST: 52 U/L — ABNORMAL HIGH (ref 15–41)
Albumin: 2.6 g/dL — ABNORMAL LOW (ref 3.5–5.0)
Anion gap: 4 — ABNORMAL LOW (ref 5–15)
BILIRUBIN TOTAL: 2.6 mg/dL — AB (ref 0.3–1.2)
BUN: 8 mg/dL (ref 8–23)
CALCIUM: 9.1 mg/dL (ref 8.9–10.3)
CHLORIDE: 106 mmol/L (ref 98–111)
CO2: 27 mmol/L (ref 22–32)
CREATININE: 0.74 mg/dL (ref 0.61–1.24)
GFR calc non Af Amer: >60 mL/min (ref >60)
Glucose, Bld: 103 mg/dL — ABNORMAL HIGH (ref 70–99)
Potassium: 4.1 mmol/L (ref 3.5–5.1)
SODIUM: 137 mmol/L (ref 135–145)
Total Protein: 5.5 g/dL — ABNORMAL LOW (ref 6.5–8.1)

## 2018-06-16 LAB — SURGICAL PCR SCREEN
MRSA, PCR: NEGATIVE
STAPHYLOCOCCUS AUREUS: NEGATIVE

## 2018-06-16 LAB — AMYLASE: Amylase: 1558 U/L — ABNORMAL HIGH (ref 28–100)

## 2018-06-16 MED ORDER — CHLORHEXIDINE GLUCONATE CLOTH 2 % EX PADS: 6.0000 | MEDICATED_PAD | Freq: Once | CUTANEOUS | Status: AC

## 2018-06-16 MED ORDER — SIMETHICONE 40 MG/0.6ML PO SUSP
40.0000 mg | Freq: Once | ORAL | Status: AC
  Administered 2018-06-16: 40 mg via ORAL
  Filled 2018-06-16: qty 30

## 2018-06-16 MED ORDER — ENOXAPARIN SODIUM 40 MG/0.4ML ~~LOC~~ SOLN
40.0000 mg | Freq: Once | SUBCUTANEOUS | Status: AC
  Administered 2018-06-17: 40 mg via SUBCUTANEOUS
  Filled 2018-06-16: qty 0.4

## 2018-06-16 MED ORDER — CHLORHEXIDINE GLUCONATE CLOTH 2 % EX PADS
6.0000 | MEDICATED_PAD | Freq: Once | CUTANEOUS | Status: AC
  Administered 2018-06-16: 6 via TOPICAL

## 2018-06-16 MED ORDER — CHLORHEXIDINE GLUCONATE CLOTH 2 % EX PADS: 6.0000 | MEDICATED_PAD | Freq: Once | CUTANEOUS | Status: DC

## 2018-06-16 MED ORDER — HYDROCORTISONE 1 % EX CREA
1.0000 "application " | TOPICAL_CREAM | Freq: Three times a day (TID) | CUTANEOUS | Status: DC | PRN
  Administered 2018-06-16: 1 via TOPICAL
  Filled 2018-06-16: qty 28

## 2018-06-16 NOTE — H&P (View-Only) (Signed)
1 Day Post-Op  Subjective: Patient underwent ERCP with stent placement yesterday.  The common bile duct stone was impacted but a stent was successfully placed to decompress the system.  Patient currently has no significant epigastric pain.  He does have some gas pain.  Objective: Vital signs in last 24 hours: Temp:  [97.6 F (36.4 C)-99.3 F (37.4 C)] 98.2 F (36.8 C) (08/01 0558) Pulse Rate:  [63-72] 71 (08/01 0558) Resp:  [12-21] 20 (08/01 0558) BP: (111-140)/(53-85) 124/74 (08/01 0558) SpO2:  [96 %-100 %] 99 % (08/01 0558) Weight:  [183 lb (83 kg)] 183 lb (83 kg) (07/31 1142) Last BM Date: 06/14/18  Intake/Output from previous day: 07/31 0701 - 08/01 0700 In: 2899.2 [P.O.:360; I.V.:1802.5; IV Piggyback:736.7] Out: 650 [Urine:650] Intake/Output this shift: No intake/output data recorded.  General appearance: alert, cooperative and no distress GI: soft, non-tender; bowel sounds normal; no masses,  no organomegaly  Lab Results:  Recent Labs    06/15/18 1603 06/16/18 0354  WBC 8.4 6.9  HGB 12.2* 11.2*  HCT 36.2* 33.2*  PLT 203 191   BMET Recent Labs    06/15/18 1521 06/16/18 0354  NA 138 137  K 4.1 4.1  CL 106 106  CO2 27 27  GLUCOSE 111* 103*  BUN 10 8  CREATININE 0.92 0.74  CALCIUM 9.1 9.1   PT/INR No results for input(s): LABPROT, INR in the last 72 hours.  Studies/Results: Mr 3d Recon At Scanner  Result Date: 06/14/2018 CLINICAL DATA:  Cholelithiasis, abnormal LFTs, dilated CBD EXAM: MRI ABDOMEN WITHOUT AND WITH CONTRAST (INCLUDING MRCP) TECHNIQUE: Multiplanar multisequence MR imaging of the abdomen was performed both before and after the administration of intravenous contrast. Heavily T2-weighted images of the biliary and pancreatic ducts were obtained, and three-dimensional MRCP images were rendered by post processing. CONTRAST:  92mL MULTIHANCE GADOBENATE DIMEGLUMINE 529 MG/ML IV SOLN COMPARISON:  [facility] CT abdomen/pelvis dated 06/01/2018 FINDINGS:  Lower chest: Lung bases are clear. Hepatobiliary: Liver is within normal limits. No morphologic findings of cirrhosis. No suspicious/enhancing hepatic lesions. No hepatic steatosis. Layering small gallstones with gallbladder sludge (series 26/image 40). Distended gallbladder. No associated inflammatory changes. Mild intrahepatic ductal dilatation. Moderate extrahepatic ductal dilatation, measuring 15 mm. Associated 11 mm filling defect in the distal CBD at the ampulla (series 3/image 22). This was not conspicuous on prior CT. While an ampullary lesion is possible, the appearance favors a distal CBD stone. Pancreas:  Within normal limits. Spleen:  Within normal limits. Adrenals/Urinary Tract:  Adrenal glands are within normal limits. Kidneys are within normal limits.  No hydronephrosis. Stomach/Bowel: Stomach is notable for a tiny hiatal hernia. Visualized bowel is unremarkable. Vascular/Lymphatic:  No evidence of abdominal aortic aneurysm. No suspicious abdominal lymphadenopathy. Other:  No abdominal ascites. Musculoskeletal: No focal osseous lesions. IMPRESSION: Cholelithiasis, without associated findings to suggest acute cholecystitis. Intrahepatic and extrahepatic ductal dilatation. Dilated common duct, measuring 15 mm. Associated 11 mm filling defect in the distal CBD at the ampulla, favoring a distal CBD stone. ERCP is suggested. Electronically Signed   By: Julian Hy M.D.   On: 06/14/2018 20:34   Dg Ercp Biliary & Pancreatic Ducts  Result Date: 06/15/2018 CLINICAL DATA:  Common duct obstruction EXAM: ERCP TECHNIQUE: Multiple spot images obtained with the fluoroscopic device and submitted for interpretation post-procedure. FLUOROSCOPY TIME:  Fluoroscopy Time:  2 minutes and 28 seconds Radiation Exposure Index (if provided by the fluoroscopic device): 68.62 mGy Number of Acquired Spot Images: 0 COMPARISON:  None. FINDINGS: Balloon stone retrieval in  the common bile duct is documented. A biliary stent  is seen across the distal common bile duct on the final image. IMPRESSION: See above. These images were submitted for radiologic interpretation only. Please see the procedural report for the amount of contrast and the fluoroscopy time utilized. Electronically Signed   By: Marybelle Killings M.D.   On: 06/15/2018 15:53   Mr Abdomen Mrcp Moise Boring Contast  Result Date: 06/14/2018 CLINICAL DATA:  Cholelithiasis, abnormal LFTs, dilated CBD EXAM: MRI ABDOMEN WITHOUT AND WITH CONTRAST (INCLUDING MRCP) TECHNIQUE: Multiplanar multisequence MR imaging of the abdomen was performed both before and after the administration of intravenous contrast. Heavily T2-weighted images of the biliary and pancreatic ducts were obtained, and three-dimensional MRCP images were rendered by post processing. CONTRAST:  67mL MULTIHANCE GADOBENATE DIMEGLUMINE 529 MG/ML IV SOLN COMPARISON:  [facility] CT abdomen/pelvis dated 06/01/2018 FINDINGS: Lower chest: Lung bases are clear. Hepatobiliary: Liver is within normal limits. No morphologic findings of cirrhosis. No suspicious/enhancing hepatic lesions. No hepatic steatosis. Layering small gallstones with gallbladder sludge (series 26/image 40). Distended gallbladder. No associated inflammatory changes. Mild intrahepatic ductal dilatation. Moderate extrahepatic ductal dilatation, measuring 15 mm. Associated 11 mm filling defect in the distal CBD at the ampulla (series 3/image 22). This was not conspicuous on prior CT. While an ampullary lesion is possible, the appearance favors a distal CBD stone. Pancreas:  Within normal limits. Spleen:  Within normal limits. Adrenals/Urinary Tract:  Adrenal glands are within normal limits. Kidneys are within normal limits.  No hydronephrosis. Stomach/Bowel: Stomach is notable for a tiny hiatal hernia. Visualized bowel is unremarkable. Vascular/Lymphatic:  No evidence of abdominal aortic aneurysm. No suspicious abdominal lymphadenopathy. Other:  No abdominal ascites.  Musculoskeletal: No focal osseous lesions. IMPRESSION: Cholelithiasis, without associated findings to suggest acute cholecystitis. Intrahepatic and extrahepatic ductal dilatation. Dilated common duct, measuring 15 mm. Associated 11 mm filling defect in the distal CBD at the ampulla, favoring a distal CBD stone. ERCP is suggested. Electronically Signed   By: Julian Hy M.D.   On: 06/14/2018 20:34    Anti-infectives: Anti-infectives (From admission, onward)   Start     Dose/Rate Route Frequency Ordered Stop   06/15/18 1115  cefTRIAXone (ROCEPHIN) 1 g in sodium chloride 0.9 % 100 mL IVPB  Status:  Discontinued     1 g 200 mL/hr over 30 Minutes Intravenous Every 24 hours 06/15/18 1103 06/15/18 1110   06/15/18 1115  cefTRIAXone (ROCEPHIN) 2 g in sodium chloride 0.9 % 100 mL IVPB     2 g 200 mL/hr over 30 Minutes Intravenous Every 24 hours 06/15/18 1111        Assessment/Plan: s/p Procedure(s): ENDOSCOPIC RETROGRADE CHOLANGIOPANCREATOGRAPHY (ERCP) BILIARY STENT PLACEMENT SPHINCTEROTOMY Impression: Cholelithiasis, choledocholithiasis, resolving elevated liver enzyme tests.  Elevated amylase secondary to ERCP.  Patient clinically does not have pancreatitis. Plan: We will proceed with laparoscopic cholecystectomy, possible open tomorrow.  The risks and benefits of the procedure including bleeding, infection, hepatobiliary injury, and the possibility of an open procedure were fully explained to the patient, who gave informed consent.  LOS: 2 days    Aviva Signs 06/16/2018

## 2018-06-16 NOTE — Progress Notes (Signed)
1 Day Post-Op  Subjective: Patient underwent ERCP with stent placement yesterday.  The common bile duct stone was impacted but a stent was successfully placed to decompress the system.  Patient currently has no significant epigastric pain.  He does have some gas pain.  Objective: Vital signs in last 24 hours: Temp:  [97.6 F (36.4 C)-99.3 F (37.4 C)] 98.2 F (36.8 C) (08/01 0558) Pulse Rate:  [63-72] 71 (08/01 0558) Resp:  [12-21] 20 (08/01 0558) BP: (111-140)/(53-85) 124/74 (08/01 0558) SpO2:  [96 %-100 %] 99 % (08/01 0558) Weight:  [183 lb (83 kg)] 183 lb (83 kg) (07/31 1142) Last BM Date: 06/14/18  Intake/Output from previous day: 07/31 0701 - 08/01 0700 In: 2899.2 [P.O.:360; I.V.:1802.5; IV Piggyback:736.7] Out: 650 [Urine:650] Intake/Output this shift: No intake/output data recorded.  General appearance: alert, cooperative and no distress GI: soft, non-tender; bowel sounds normal; no masses,  no organomegaly  Lab Results:  Recent Labs    06/15/18 1603 06/16/18 0354  WBC 8.4 6.9  HGB 12.2* 11.2*  HCT 36.2* 33.2*  PLT 203 191   BMET Recent Labs    06/15/18 1521 06/16/18 0354  NA 138 137  K 4.1 4.1  CL 106 106  CO2 27 27  GLUCOSE 111* 103*  BUN 10 8  CREATININE 0.92 0.74  CALCIUM 9.1 9.1   PT/INR No results for input(s): LABPROT, INR in the last 72 hours.  Studies/Results: Mr 3d Recon At Scanner  Result Date: 06/14/2018 CLINICAL DATA:  Cholelithiasis, abnormal LFTs, dilated CBD EXAM: MRI ABDOMEN WITHOUT AND WITH CONTRAST (INCLUDING MRCP) TECHNIQUE: Multiplanar multisequence MR imaging of the abdomen was performed both before and after the administration of intravenous contrast. Heavily T2-weighted images of the biliary and pancreatic ducts were obtained, and three-dimensional MRCP images were rendered by post processing. CONTRAST:  69mL MULTIHANCE GADOBENATE DIMEGLUMINE 529 MG/ML IV SOLN COMPARISON:  [facility] CT abdomen/pelvis dated 06/01/2018 FINDINGS:  Lower chest: Lung bases are clear. Hepatobiliary: Liver is within normal limits. No morphologic findings of cirrhosis. No suspicious/enhancing hepatic lesions. No hepatic steatosis. Layering small gallstones with gallbladder sludge (series 26/image 40). Distended gallbladder. No associated inflammatory changes. Mild intrahepatic ductal dilatation. Moderate extrahepatic ductal dilatation, measuring 15 mm. Associated 11 mm filling defect in the distal CBD at the ampulla (series 3/image 22). This was not conspicuous on prior CT. While an ampullary lesion is possible, the appearance favors a distal CBD stone. Pancreas:  Within normal limits. Spleen:  Within normal limits. Adrenals/Urinary Tract:  Adrenal glands are within normal limits. Kidneys are within normal limits.  No hydronephrosis. Stomach/Bowel: Stomach is notable for a tiny hiatal hernia. Visualized bowel is unremarkable. Vascular/Lymphatic:  No evidence of abdominal aortic aneurysm. No suspicious abdominal lymphadenopathy. Other:  No abdominal ascites. Musculoskeletal: No focal osseous lesions. IMPRESSION: Cholelithiasis, without associated findings to suggest acute cholecystitis. Intrahepatic and extrahepatic ductal dilatation. Dilated common duct, measuring 15 mm. Associated 11 mm filling defect in the distal CBD at the ampulla, favoring a distal CBD stone. ERCP is suggested. Electronically Signed   By: Julian Hy M.D.   On: 06/14/2018 20:34   Dg Ercp Biliary & Pancreatic Ducts  Result Date: 06/15/2018 CLINICAL DATA:  Common duct obstruction EXAM: ERCP TECHNIQUE: Multiple spot images obtained with the fluoroscopic device and submitted for interpretation post-procedure. FLUOROSCOPY TIME:  Fluoroscopy Time:  2 minutes and 28 seconds Radiation Exposure Index (if provided by the fluoroscopic device): 68.62 mGy Number of Acquired Spot Images: 0 COMPARISON:  None. FINDINGS: Balloon stone retrieval in  the common bile duct is documented. A biliary stent  is seen across the distal common bile duct on the final image. IMPRESSION: See above. These images were submitted for radiologic interpretation only. Please see the procedural report for the amount of contrast and the fluoroscopy time utilized. Electronically Signed   By: Marybelle Killings M.D.   On: 06/15/2018 15:53   Mr Abdomen Mrcp Moise Boring Contast  Result Date: 06/14/2018 CLINICAL DATA:  Cholelithiasis, abnormal LFTs, dilated CBD EXAM: MRI ABDOMEN WITHOUT AND WITH CONTRAST (INCLUDING MRCP) TECHNIQUE: Multiplanar multisequence MR imaging of the abdomen was performed both before and after the administration of intravenous contrast. Heavily T2-weighted images of the biliary and pancreatic ducts were obtained, and three-dimensional MRCP images were rendered by post processing. CONTRAST:  70mL MULTIHANCE GADOBENATE DIMEGLUMINE 529 MG/ML IV SOLN COMPARISON:  [facility] CT abdomen/pelvis dated 06/01/2018 FINDINGS: Lower chest: Lung bases are clear. Hepatobiliary: Liver is within normal limits. No morphologic findings of cirrhosis. No suspicious/enhancing hepatic lesions. No hepatic steatosis. Layering small gallstones with gallbladder sludge (series 26/image 40). Distended gallbladder. No associated inflammatory changes. Mild intrahepatic ductal dilatation. Moderate extrahepatic ductal dilatation, measuring 15 mm. Associated 11 mm filling defect in the distal CBD at the ampulla (series 3/image 22). This was not conspicuous on prior CT. While an ampullary lesion is possible, the appearance favors a distal CBD stone. Pancreas:  Within normal limits. Spleen:  Within normal limits. Adrenals/Urinary Tract:  Adrenal glands are within normal limits. Kidneys are within normal limits.  No hydronephrosis. Stomach/Bowel: Stomach is notable for a tiny hiatal hernia. Visualized bowel is unremarkable. Vascular/Lymphatic:  No evidence of abdominal aortic aneurysm. No suspicious abdominal lymphadenopathy. Other:  No abdominal ascites.  Musculoskeletal: No focal osseous lesions. IMPRESSION: Cholelithiasis, without associated findings to suggest acute cholecystitis. Intrahepatic and extrahepatic ductal dilatation. Dilated common duct, measuring 15 mm. Associated 11 mm filling defect in the distal CBD at the ampulla, favoring a distal CBD stone. ERCP is suggested. Electronically Signed   By: Julian Hy M.D.   On: 06/14/2018 20:34    Anti-infectives: Anti-infectives (From admission, onward)   Start     Dose/Rate Route Frequency Ordered Stop   06/15/18 1115  cefTRIAXone (ROCEPHIN) 1 g in sodium chloride 0.9 % 100 mL IVPB  Status:  Discontinued     1 g 200 mL/hr over 30 Minutes Intravenous Every 24 hours 06/15/18 1103 06/15/18 1110   06/15/18 1115  cefTRIAXone (ROCEPHIN) 2 g in sodium chloride 0.9 % 100 mL IVPB     2 g 200 mL/hr over 30 Minutes Intravenous Every 24 hours 06/15/18 1111        Assessment/Plan: s/p Procedure(s): ENDOSCOPIC RETROGRADE CHOLANGIOPANCREATOGRAPHY (ERCP) BILIARY STENT PLACEMENT SPHINCTEROTOMY Impression: Cholelithiasis, choledocholithiasis, resolving elevated liver enzyme tests.  Elevated amylase secondary to ERCP.  Patient clinically does not have pancreatitis. Plan: We will proceed with laparoscopic cholecystectomy, possible open tomorrow.  The risks and benefits of the procedure including bleeding, infection, hepatobiliary injury, and the possibility of an open procedure were fully explained to the patient, who gave informed consent.  LOS: 2 days    Aviva Signs 06/16/2018

## 2018-06-16 NOTE — Progress Notes (Signed)
PROGRESS NOTE    Brandon [name]  VFI:[id number] DOB: 04-Aug-1941 DOA: 06/14/2018 PCP: Brandon Blitz, MD   Brief Narrative:   Brandon Merrow Armstrongis a 77 y.o.male6 y/o with PMH significant for colon cancer s/p descending colectomy (in remission), GERD, Gout, HLD and HTN; who came to ED as direct admission from Dr, Brandon [name] office due to concerns for cholelithiasis with choledocholithiasis.  He was noted to have intermittent episodes of abdominal pain and was noted to be jaundiced as well.  MRCP revealed common bile duct stone with dilation.  Patient underwent ERCP with stent placement on 7/31 with plans to undergo cholecystectomy in a.m.    Assessment & Plan:   Principal Problem:   Cholelithiasis with choledocholithiasis Active Problems:   Colon cancer ([place])   HTN (hypertension)   GERD (gastroesophageal reflux disease)   Jaundice   HLD (hyperlipidemia)   1. Jaundice secondary to cholelithiasis with choledocholithiasis.    He is status post ERCP with stent placement, but stone was impacted.  Plan for cholecystectomy in a.m. per general surgery. 2. Hypertension.  Stable and well-controlled.  Not currently on any medications. 3. GERD.  Continue PPI. 4. Dyslipidemia.  Hold statins in the setting of elevated LFTs. 5. History of colon cancer-currently in remission. 6. History of CAD-nonobstructive.  Continue aspirin and resume statins when liver function returns back to normal.   DVT prophylaxis:SCDs Code Status: Full Family Communication: None at bedside Disposition Plan: Plan for cholecystectomy in a.m.   Consultants:   GI  General surgery  Procedures:   ERCP with stent placement 7/31  Antimicrobials:   Rocephin 7/31->   Subjective: Patient seen and evaluated today with no new acute complaints or concerns. No acute concerns or events noted overnight.  He has some symptoms with flatulence and excessive gas, but is otherwise doing well.  Objective: Vitals:   06/15/18 1515 06/15/18 1541 06/15/18 2142 06/16/18 0558  BP: 123/63 111/85 123/61 124/74  Pulse: 64 69 72 71  Resp: 20 18 18 20   Temp:  97.6 F (36.4 C) 99.3 F (37.4 C) 98.2 F (36.8 C)  TempSrc:  Oral Oral Oral  SpO2: 99% 100% 99% 99%  Weight:      Height:        Intake/Output Summary (Last 24 hours) at 06/16/2018 0912 Last data filed at 06/15/2018 1800 Gross per 24 hour  Intake 2899.17 ml  Output 400 ml  Net 2499.17 ml   Filed Weights   06/14/18 1121 06/15/18 1142  Weight: 83 kg (183 lb) 83 kg (183 lb)    Examination:  General exam: Appears calm and comfortable  Respiratory system: Clear to auscultation. Respiratory effort normal. Cardiovascular system: S1 & S2 heard, RRR. No JVD, murmurs, rubs, gallops or clicks. No pedal edema. Gastrointestinal system: Abdomen is nondistended, soft and nontender. No organomegaly or masses felt. Normal bowel sounds heard. Central nervous system: Alert and oriented. No focal neurological deficits. Extremities: Symmetric 5 x 5 power. Skin: No rashes, lesions or ulcers Psychiatry: Judgement and insight appear normal. Mood & affect appropriate.     Data Reviewed: I have personally reviewed following labs and imaging studies  CBC: Recent Labs  Lab 06/14/18 1716 06/15/18 0422 06/15/18 1603 06/16/18 0354  WBC 9.2 5.5 8.4 6.9  NEUTROABS 7.0  --   --   --   HGB 11.8* 11.1* 12.2* 11.2*  HCT 35.3* 33.1* 36.2* 33.2*  MCV 98.9 98.5 98.9 98.2  PLT 231 202 203 416   Basic Metabolic Panel: Recent Labs  Lab 06/14/18 1716 06/14/18 1754 06/15/18 0422 06/15/18 1521 06/16/18 0354  NA 137  --  138 138 137  K 4.2  --  4.2 4.1 4.1  CL 104  --  106 106 106  CO2 27  --  26 27 27   GLUCOSE 131*  --  98 111* 103*  BUN 10  --  8 10 8   CREATININE 0.91 0.80 0.83 0.92 0.74  CALCIUM 9.5  --  9.3 9.1 9.1  MG 1.8  --   --   --   --   PHOS 2.6  --   --   --   --    GFR: Estimated Creatinine Clearance: 82.4 mL/min (by C-G formula based on SCr of  0.74 mg/dL). Liver Function Tests: Recent Labs  Lab 06/14/18 1716 06/15/18 1521 06/16/18 0354  AST 127* 70* 52*  ALT 79* 59* 49*  ALKPHOS 909* 686* 602*  BILITOT 3.7* 3.2* 2.6*  PROT 6.4* 5.6* 5.5*  ALBUMIN 3.1* 2.8* 2.6*   Recent Labs  Lab 06/15/18 1521 06/16/18 0354  AMYLASE 123* 1,558*   No results for input(s): AMMONIA in the last 168 hours. Coagulation Profile: No results for input(s): INR, PROTIME in the last 168 hours. Cardiac Enzymes: No results for input(s): CKTOTAL, CKMB, CKMBINDEX, TROPONINI in the last 168 hours. BNP (last 3 results) No results for input(s): PROBNP in the last 8760 hours. HbA1C: No results for input(s): HGBA1C in the last 72 hours. CBG: No results for input(s): GLUCAP in the last 168 hours. Lipid Profile: No results for input(s): CHOL, HDL, LDLCALC, TRIG, CHOLHDL, LDLDIRECT in the last 72 hours. Thyroid Function Tests: No results for input(s): TSH, T4TOTAL, FREET4, T3FREE, THYROIDAB in the last 72 hours. Anemia Panel: No results for input(s): VITAMINB12, FOLATE, FERRITIN, TIBC, IRON, RETICCTPCT in the last 72 hours. Sepsis Labs: No results for input(s): PROCALCITON, LATICACIDVEN in the last 168 hours.  No results found for this or any previous visit (from the past 240 hour(s)).       Radiology Studies: Mr 3d Recon At Scanner  Result Date: 06/14/2018 CLINICAL DATA:  Cholelithiasis, abnormal LFTs, dilated CBD EXAM: MRI ABDOMEN WITHOUT AND WITH CONTRAST (INCLUDING MRCP) TECHNIQUE: Multiplanar multisequence MR imaging of the abdomen was performed both before and after the administration of intravenous contrast. Heavily T2-weighted images of the biliary and pancreatic ducts were obtained, and three-dimensional MRCP images were rendered by post processing. CONTRAST:  62mL MULTIHANCE GADOBENATE DIMEGLUMINE 529 MG/ML IV SOLN COMPARISON:  [facility] CT abdomen/pelvis dated 06/01/2018 FINDINGS: Lower chest: Lung bases are clear. Hepatobiliary: Liver  is within normal limits. No morphologic findings of cirrhosis. No suspicious/enhancing hepatic lesions. No hepatic steatosis. Layering small gallstones with gallbladder sludge (series 26/image 40). Distended gallbladder. No associated inflammatory changes. Mild intrahepatic ductal dilatation. Moderate extrahepatic ductal dilatation, measuring 15 mm. Associated 11 mm filling defect in the distal CBD at the ampulla (series 3/image 22). This was not conspicuous on prior CT. While an ampullary lesion is possible, the appearance favors a distal CBD stone. Pancreas:  Within normal limits. Spleen:  Within normal limits. Adrenals/Urinary Tract:  Adrenal glands are within normal limits. Kidneys are within normal limits.  No hydronephrosis. Stomach/Bowel: Stomach is notable for a tiny hiatal hernia. Visualized bowel is unremarkable. Vascular/Lymphatic:  No evidence of abdominal aortic aneurysm. No suspicious abdominal lymphadenopathy. Other:  No abdominal ascites. Musculoskeletal: No focal osseous lesions. IMPRESSION: Cholelithiasis, without associated findings to suggest acute cholecystitis. Intrahepatic and extrahepatic ductal dilatation. Dilated common duct, measuring 15 mm.  Associated 11 mm filling defect in the distal CBD at the ampulla, favoring a distal CBD stone. ERCP is suggested. Electronically Signed   By: Julian Hy M.D.   On: 06/14/2018 20:34   Dg Ercp Biliary & Pancreatic Ducts  Result Date: 06/15/2018 CLINICAL DATA:  Common duct obstruction EXAM: ERCP TECHNIQUE: Multiple spot images obtained with the fluoroscopic device and submitted for interpretation post-procedure. FLUOROSCOPY TIME:  Fluoroscopy Time:  2 minutes and 28 seconds Radiation Exposure Index (if provided by the fluoroscopic device): 68.62 mGy Number of Acquired Spot Images: 0 COMPARISON:  None. FINDINGS: Balloon stone retrieval in the common bile duct is documented. A biliary stent is seen across the distal common bile duct on the final  image. IMPRESSION: See above. These images were submitted for radiologic interpretation only. Please see the procedural report for the amount of contrast and the fluoroscopy time utilized. Electronically Signed   By: Marybelle Killings M.D.   On: 06/15/2018 15:53   Mr Abdomen Mrcp Moise Boring Contast  Result Date: 06/14/2018 CLINICAL DATA:  Cholelithiasis, abnormal LFTs, dilated CBD EXAM: MRI ABDOMEN WITHOUT AND WITH CONTRAST (INCLUDING MRCP) TECHNIQUE: Multiplanar multisequence MR imaging of the abdomen was performed both before and after the administration of intravenous contrast. Heavily T2-weighted images of the biliary and pancreatic ducts were obtained, and three-dimensional MRCP images were rendered by post processing. CONTRAST:  66mL MULTIHANCE GADOBENATE DIMEGLUMINE 529 MG/ML IV SOLN COMPARISON:  [facility] CT abdomen/pelvis dated 06/01/2018 FINDINGS: Lower chest: Lung bases are clear. Hepatobiliary: Liver is within normal limits. No morphologic findings of cirrhosis. No suspicious/enhancing hepatic lesions. No hepatic steatosis. Layering small gallstones with gallbladder sludge (series 26/image 40). Distended gallbladder. No associated inflammatory changes. Mild intrahepatic ductal dilatation. Moderate extrahepatic ductal dilatation, measuring 15 mm. Associated 11 mm filling defect in the distal CBD at the ampulla (series 3/image 22). This was not conspicuous on prior CT. While an ampullary lesion is possible, the appearance favors a distal CBD stone. Pancreas:  Within normal limits. Spleen:  Within normal limits. Adrenals/Urinary Tract:  Adrenal glands are within normal limits. Kidneys are within normal limits.  No hydronephrosis. Stomach/Bowel: Stomach is notable for a tiny hiatal hernia. Visualized bowel is unremarkable. Vascular/Lymphatic:  No evidence of abdominal aortic aneurysm. No suspicious abdominal lymphadenopathy. Other:  No abdominal ascites. Musculoskeletal: No focal osseous lesions. IMPRESSION:  Cholelithiasis, without associated findings to suggest acute cholecystitis. Intrahepatic and extrahepatic ductal dilatation. Dilated common duct, measuring 15 mm. Associated 11 mm filling defect in the distal CBD at the ampulla, favoring a distal CBD stone. ERCP is suggested. Electronically Signed   By: Julian Hy M.D.   On: 06/14/2018 20:34        Scheduled Meds: . pantoprazole  40 mg Oral Daily   Continuous Infusions: . sodium chloride 75 mL/hr at 06/16/18 0135  . cefTRIAXone (ROCEPHIN)  IV Stopped (06/15/18 1545)     LOS: 2 days    Time spent: 30 minutes    Brandon Bucio Darleen Crocker, Brandon [name] [facility] Pager [phone]  If 7PM-7AM, please contact night-coverage [email or web address] Password [email or web address] 06/16/2018, 9:12 AM

## 2018-06-16 NOTE — Progress Notes (Signed)
  Subjective:  Patient states he feels much better.  He denies epigastric pain.  He also denies nausea or vomiting.  Only pain he has is in the right lower quadrant and he said this pain is less than before.  He has hernia in this area.  He states he had 2 bowel movements and stool is not clear-colored anymore.  Objective: Blood pressure (!) 124/56, pulse 60, temperature 97.7 F (36.5 C), temperature source Oral, resp. rate 16, height 5\' 10"  (1.778 m), weight 183 lb (83 kg), SpO2 100 %. Patient is alert and in no acute distress. Sclera remains mildly icteric. Cardiac exam with regular rhythm normal S1 and S2.  No murmur gallop noted. Abdomen is symmetrical.  Bowel sounds are normal.  On palpation abdomen is soft and nontender with organomegaly or masses. No peripheral edema or clubbing noted.   Labs/studies Results:  Recent Labs    07-05-2018 0422 July 05, 2018 1603 06/16/18 0354  WBC 5.5 8.4 6.9  HGB 11.1* 12.2* 11.2*  HCT 33.1* 36.2* 33.2*  PLT 202 203 191    BMET  Recent Labs    07-05-2018 0422 05-Jul-2018 1521 06/16/18 0354  NA 138 138 137  K 4.2 4.1 4.1  CL 106 106 106  CO2 26 27 27   GLUCOSE 98 111* 103*  BUN 8 10 8   CREATININE 0.83 0.92 0.74  CALCIUM 9.3 9.1 9.1    LFT  Recent Labs    06/14/18 1716 2018/07/05 1521 06/16/18 0354  PROT 6.4* 5.6* 5.5*  ALBUMIN 3.1* 2.8* 2.6*  AST 127* 70* 52*  ALT 79* 59* 49*  ALKPHOS 909* 686* 602*  BILITOT 3.7* 3.2* 2.6*     Serum amylase 1558  Assessment:  #1.  Choledocholithiasis.  Patient is status post ERCP with biliary stenting yesterday.  LFTs trending down.  He feels much better.  His stools are not clay colored anymore.    #2.  Post ERCP bump in serum amylase without any other features of pancreatitis.   #3.Cholelithiasis.  Patient for cholecystectomy in a.m.  #4.  Mild anemia possibly due to acute illness.  No evidence of overt GI bleed.  Recommendations:  Repeat labs in a.m. including serum  amylase. Cholecystectomy as planned by Dr. Arnoldo Morale. Repeat ERCP plus minus spyglass examination and 6 weeks or so.

## 2018-06-17 ENCOUNTER — Inpatient Hospital Stay (HOSPITAL_COMMUNITY): Payer: Medicare HMO | Admitting: Anesthesiology

## 2018-06-17 ENCOUNTER — Encounter (HOSPITAL_COMMUNITY): Payer: Self-pay

## 2018-06-17 ENCOUNTER — Encounter (HOSPITAL_COMMUNITY)
Admission: AD | Disposition: A | Discharge status: Home / Self Care | Payer: Self-pay | Source: Ambulatory Visit | Attending: Internal Medicine

## 2018-06-17 ENCOUNTER — Other Ambulatory Visit: Payer: Self-pay

## 2018-06-17 HISTORY — PX: CHOLECYSTECTOMY: SHX55

## 2018-06-17 LAB — COMPREHENSIVE METABOLIC PANEL
ALK PHOS: 515 U/L — AB (ref 38–126)
ALT: 36 U/L (ref 0–44)
AST: 30 U/L (ref 15–41)
Albumin: 2.7 g/dL — ABNORMAL LOW (ref 3.5–5.0)
Anion gap: 6 (ref 5–15)
BILIRUBIN TOTAL: 2 mg/dL — AB (ref 0.3–1.2)
BUN: <5 mg/dL — ABNORMAL LOW (ref 8–23)
CALCIUM: 9.3 mg/dL (ref 8.9–10.3)
CO2: 26 mmol/L (ref 22–32)
Chloride: 107 mmol/L (ref 98–111)
Creatinine, Ser: 0.74 mg/dL (ref 0.61–1.24)
GFR calc Af Amer: >60 mL/min (ref >60)
Glucose, Bld: 104 mg/dL — ABNORMAL HIGH (ref 70–99)
POTASSIUM: 4.5 mmol/L (ref 3.5–5.1)
Sodium: 139 mmol/L (ref 135–145)
Total Protein: 5.6 g/dL — ABNORMAL LOW (ref 6.5–8.1)

## 2018-06-17 LAB — CBC
HCT: 35.3 % — ABNORMAL LOW (ref 39.0–52.0)
Hemoglobin: 11.7 g/dL — ABNORMAL LOW (ref 13.0–17.0)
MCH: 32.7 pg (ref 26.0–34.0)
MCHC: 33.1 g/dL (ref 30.0–36.0)
MCV: 98.6 fL (ref 78.0–100.0)
Platelets: 209 10*3/uL (ref 150–400)
RBC: 3.58 MIL/uL — AB (ref 4.22–5.81)
RDW: 13.8 % (ref 11.5–15.5)
WBC: 5.3 10*3/uL (ref 4.0–10.5)

## 2018-06-17 LAB — AMYLASE: Amylase: 204 U/L — ABNORMAL HIGH (ref 28–100)

## 2018-06-17 SURGERY — LAPAROSCOPIC CHOLECYSTECTOMY
Anesthesia: General | Site: Abdomen

## 2018-06-17 MED ORDER — PROPOFOL 10 MG/ML IV BOLUS
INTRAVENOUS | Status: AC
  Filled 2018-06-17: qty 40

## 2018-06-17 MED ORDER — DIPHENHYDRAMINE HCL 12.5 MG/5ML PO ELIX: 12.5000 mg | ORAL_SOLUTION | Freq: Four times a day (QID) | ORAL | Status: DC | PRN

## 2018-06-17 MED ORDER — KETOROLAC TROMETHAMINE 30 MG/ML IJ SOLN: 30.0000 mg | Freq: Four times a day (QID) | INTRAMUSCULAR | Status: DC | PRN

## 2018-06-17 MED ORDER — KETOROLAC TROMETHAMINE 30 MG/ML IJ SOLN
INTRAMUSCULAR | Status: AC
  Filled 2018-06-17: qty 1

## 2018-06-17 MED ORDER — BUPIVACAINE LIPOSOME 1.3 % IJ SUSP
INTRAMUSCULAR | Status: DC | PRN
  Administered 2018-06-17: 20 mL

## 2018-06-17 MED ORDER — GLYCOPYRROLATE 0.2 MG/ML IJ SOLN
INTRAMUSCULAR | Status: DC | PRN
  Administered 2018-06-17: .6 mg via INTRAVENOUS

## 2018-06-17 MED ORDER — ENOXAPARIN SODIUM 40 MG/0.4ML ~~LOC~~ SOLN
40.0000 mg | SUBCUTANEOUS | Status: DC
  Administered 2018-06-18 – 2018-06-19 (×2): 40 mg via SUBCUTANEOUS
  Filled 2018-06-17 (×2): qty 0.4

## 2018-06-17 MED ORDER — ROCURONIUM BROMIDE 100 MG/10ML IV SOLN
INTRAVENOUS | Status: DC | PRN
  Administered 2018-06-17: 10 mg via INTRAVENOUS
  Administered 2018-06-17: 30 mg via INTRAVENOUS

## 2018-06-17 MED ORDER — LACTATED RINGERS IV SOLN: INTRAVENOUS | Status: DC

## 2018-06-17 MED ORDER — LACTATED RINGERS IV SOLN
INTRAVENOUS | Status: DC
  Administered 2018-06-17: 12:00:00 via INTRAVENOUS

## 2018-06-17 MED ORDER — POVIDONE-IODINE 10 % EX OINT
TOPICAL_OINTMENT | CUTANEOUS | Status: AC
  Filled 2018-06-17: qty 1

## 2018-06-17 MED ORDER — BUPIVACAINE LIPOSOME 1.3 % IJ SUSP
INTRAMUSCULAR | Status: AC
  Filled 2018-06-17: qty 20

## 2018-06-17 MED ORDER — PROPOFOL 10 MG/ML IV BOLUS
INTRAVENOUS | Status: DC | PRN
  Administered 2018-06-17: 100 mg via INTRAVENOUS

## 2018-06-17 MED ORDER — FENTANYL CITRATE (PF) 100 MCG/2ML IJ SOLN
INTRAMUSCULAR | Status: DC | PRN
  Administered 2018-06-17 (×2): 25 ug via INTRAVENOUS
  Administered 2018-06-17: 75 ug via INTRAVENOUS
  Administered 2018-06-17: 50 ug via INTRAVENOUS
  Administered 2018-06-17: 25 ug via INTRAVENOUS
  Administered 2018-06-17: 50 ug via INTRAVENOUS

## 2018-06-17 MED ORDER — FENTANYL CITRATE (PF) 250 MCG/5ML IJ SOLN
INTRAMUSCULAR | Status: AC
  Filled 2018-06-17: qty 5

## 2018-06-17 MED ORDER — BUPIVACAINE HCL (PF) 0.5 % IJ SOLN
INTRAMUSCULAR | Status: AC
  Filled 2018-06-17: qty 30

## 2018-06-17 MED ORDER — POVIDONE-IODINE 10 % OINT PACKET
TOPICAL_OINTMENT | CUTANEOUS | Status: DC | PRN
  Administered 2018-06-17: 1 via TOPICAL

## 2018-06-17 MED ORDER — KETOROLAC TROMETHAMINE 30 MG/ML IJ SOLN
30.0000 mg | Freq: Four times a day (QID) | INTRAMUSCULAR | Status: AC
  Administered 2018-06-17: 30 mg via INTRAVENOUS
  Filled 2018-06-17: qty 1

## 2018-06-17 MED ORDER — SODIUM CHLORIDE 0.9 % IR SOLN
Status: DC | PRN
  Administered 2018-06-17: 1

## 2018-06-17 MED ORDER — TRAMADOL HCL 50 MG PO TABS
50.0000 mg | ORAL_TABLET | Freq: Four times a day (QID) | ORAL | Status: DC | PRN
Start: 2018-06-17 — End: 2018-06-19
  Administered 2018-06-19: 50 mg via ORAL
  Filled 2018-06-17: qty 1

## 2018-06-17 MED ORDER — PROMETHAZINE HCL 25 MG/ML IJ SOLN: 6.2500 mg | INTRAMUSCULAR | Status: DC | PRN

## 2018-06-17 MED ORDER — MORPHINE SULFATE (PF) 2 MG/ML IV SOLN
2.0000 mg | INTRAVENOUS | Status: DC | PRN
  Administered 2018-06-17 – 2018-06-18 (×5): 2 mg via INTRAVENOUS
  Filled 2018-06-17 (×6): qty 1

## 2018-06-17 MED ORDER — HEMOSTATIC AGENTS (NO CHARGE) OPTIME
TOPICAL | Status: DC | PRN
  Administered 2018-06-17: 1 via TOPICAL

## 2018-06-17 MED ORDER — MEPERIDINE HCL 50 MG/ML IJ SOLN
6.2500 mg | INTRAMUSCULAR | Status: AC | PRN
  Administered 2018-06-17 (×4): 12.5 mg via INTRAVENOUS
  Filled 2018-06-17: qty 1

## 2018-06-17 MED ORDER — NEOSTIGMINE METHYLSULFATE 10 MG/10ML IV SOLN
INTRAVENOUS | Status: DC | PRN
  Administered 2018-06-17: 4 mg via INTRAVENOUS

## 2018-06-17 MED ORDER — DIPHENHYDRAMINE HCL 50 MG/ML IJ SOLN: 12.5000 mg | Freq: Four times a day (QID) | INTRAMUSCULAR | Status: DC | PRN

## 2018-06-17 MED ORDER — HYDROCODONE-ACETAMINOPHEN 7.5-325 MG PO TABS
1.0000 | ORAL_TABLET | Freq: Once | ORAL | Status: DC | PRN
Start: 2018-06-17 — End: 2018-06-17

## 2018-06-17 MED ORDER — SIMETHICONE 80 MG PO CHEW: 40.0000 mg | CHEWABLE_TABLET | Freq: Four times a day (QID) | ORAL | Status: DC | PRN

## 2018-06-17 SURGICAL SUPPLY — 51 items
APPLIER CLIP 13 LRG OPEN (CLIP) ×3
APPLIER CLIP ROT 10 11.4 M/L (STAPLE) ×3
BAG RETRIEVAL 10 (BASKET) ×1
BAG RETRIEVAL 10MM (BASKET) ×1
CHLORAPREP W/TINT 26ML (MISCELLANEOUS) ×3 IMPLANT
CLIP APPLIE 13 LRG OPEN (CLIP) IMPLANT
CLIP APPLIE ROT 10 11.4 M/L (STAPLE) ×1 IMPLANT
CLOTH BEACON ORANGE TIMEOUT ST (SAFETY) ×3 IMPLANT
COVER LIGHT HANDLE STERIS (MISCELLANEOUS) ×6 IMPLANT
DECANTER SPIKE VIAL GLASS SM (MISCELLANEOUS) ×3 IMPLANT
DRSG TEGADERM 2-3/8X2-3/4 SM (GAUZE/BANDAGES/DRESSINGS) ×2 IMPLANT
ELECT REM PT RETURN 9FT ADLT (ELECTROSURGICAL) ×3
ELECTRODE REM PT RTRN 9FT ADLT (ELECTROSURGICAL) ×1 IMPLANT
FILTER SMOKE EVAC LAPAROSHD (FILTER) ×3 IMPLANT
GAUZE SPONGE 4X4 12PLY STRL (GAUZE/BANDAGES/DRESSINGS) ×2 IMPLANT
GLOVE BIOGEL PI IND STRL 7.0 (GLOVE) ×1 IMPLANT
GLOVE BIOGEL PI INDICATOR 7.0 (GLOVE) ×2
GLOVE SURG SS PI 7.5 STRL IVOR (GLOVE) ×3 IMPLANT
GOWN STRL REUS W/ TWL XL LVL3 (GOWN DISPOSABLE) ×1 IMPLANT
GOWN STRL REUS W/TWL LRG LVL3 (GOWN DISPOSABLE) ×6 IMPLANT
GOWN STRL REUS W/TWL XL LVL3 (GOWN DISPOSABLE) ×2
HEMOSTAT SNOW SURGICEL 2X4 (HEMOSTASIS) ×3 IMPLANT
INST SET LAPROSCOPIC AP (KITS) ×3 IMPLANT
IV NS IRRIG 3000ML ARTHROMATIC (IV SOLUTION) IMPLANT
KIT TURNOVER KIT A (KITS) ×3 IMPLANT
MANIFOLD NEPTUNE II (INSTRUMENTS) ×3 IMPLANT
NDL INSUFFLATION 14GA 120MM (NEEDLE) ×1 IMPLANT
NEEDLE INSUFFLATION 14GA 120MM (NEEDLE) ×3 IMPLANT
NS IRRIG 1000ML POUR BTL (IV SOLUTION) ×3 IMPLANT
PACK LAP CHOLE LZT030E (CUSTOM PROCEDURE TRAY) ×3 IMPLANT
PAD ARMBOARD 7.5X6 YLW CONV (MISCELLANEOUS) ×3 IMPLANT
SET BASIN LINEN APH (SET/KITS/TRAYS/PACK) ×3 IMPLANT
SET TUBE IRRIG SUCTION NO TIP (IRRIGATION / IRRIGATOR) IMPLANT
SLEEVE ENDOPATH XCEL 5M (ENDOMECHANICALS) ×3 IMPLANT
SPONGE GAUZE 2X2 8PLY STER LF (GAUZE/BANDAGES/DRESSINGS) ×1
SPONGE GAUZE 2X2 8PLY STRL LF (GAUZE/BANDAGES/DRESSINGS) ×5 IMPLANT
SPONGE INTESTINAL PEANUT (DISPOSABLE) ×2 IMPLANT
STAPLER VISISTAT (STAPLE) ×3 IMPLANT
SUT SILK 0 FSL (SUTURE) ×2 IMPLANT
SUT VIC AB 0 CT1 27 (SUTURE) ×4
SUT VIC AB 0 CT1 27XBRD ANTBC (SUTURE) IMPLANT
SUT VICRYL 0 UR6 27IN ABS (SUTURE) ×3 IMPLANT
SYS BAG RETRIEVAL 10MM (BASKET) ×1
SYSTEM BAG RETRIEVAL 10MM (BASKET) ×1 IMPLANT
TROCAR ENDO BLADELESS 11MM (ENDOMECHANICALS) ×3 IMPLANT
TROCAR XCEL NON-BLD 5MMX100MML (ENDOMECHANICALS) ×3 IMPLANT
TROCAR XCEL UNIV SLVE 11M 100M (ENDOMECHANICALS) ×3 IMPLANT
TUBE CONNECTING 12'X1/4 (SUCTIONS) ×1
TUBE CONNECTING 12X1/4 (SUCTIONS) ×2 IMPLANT
TUBING INSUFFLATION (TUBING) ×3 IMPLANT
WARMER LAPAROSCOPE (MISCELLANEOUS) ×3 IMPLANT

## 2018-06-17 NOTE — Addendum Note (Signed)
Addendum  created 06/17/18 1506 by Ollen Bowl, CRNA   Attestation recorded in [place], [place] filed

## 2018-06-17 NOTE — Interval H&P Note (Signed)
History and Physical Interval Note:  06/17/2018 12:20 PM  Brandon [name]  has presented today for surgery, with the diagnosis of cholelithiasis  The various methods of treatment have been discussed with the patient and family. After consideration of risks, benefits and other options for treatment, the patient has consented to  Procedure(s): LAPAROSCOPIC CHOLECYSTECTOMY (N/A) as a surgical intervention .  The patient's history has been reviewed, patient examined, no change in status, stable for surgery.  I have reviewed the patient's chart and labs.  Questions were answered to the patient's satisfaction.     Aviva Signs

## 2018-06-17 NOTE — Op Note (Signed)
Patient:  Brandon [name]  DOB:  08/24/1941  MRN:  [id number]   Preop Diagnosis: Biliary colic, cholelithiasis, choledocholithiasis  Postop Diagnosis: Same  Procedure: Open cholecystectomy, attempted laparoscopic  Surgeon: Aviva Signs, MD  Anes: General endotracheal  Indications: Patient is a 77 year old white male who presented to my office with mild jaundice and dilated common bile duct.  He subsequently underwent ERCP with stent placement.  The common bile duct stone was impacted and could not be removed at that time.  He does have cholelithiasis.  He now presents for laparoscopic cholecystectomy.  The risks and benefits of the procedure including bleeding, infection, hepatobiliary injury, and the possibility of an open procedure were fully explained to the patient, who gave informed consent.  Procedure note: The patient was placed in supine position.  After induction of general endotracheal anesthesia, the abdomen was prepped and draped using the usual sterile technique with DuraPrep.  Surgical site confirmation was performed.  The patient has had multiple previous surgeries.  A supraumbilical incision to the right of the umbilicus and away from a previous surgical scar with was made.  A Veress needle easily passed into the abdominal cavity and confirmation of placement was done using saline drop test.  The abdomen was then insufflated to 16 mmHg pressure.  I attempted to place an 11 port through this incision, but found that the patient had had previous mesh repair and I could not pass the blunt trocar through the mesh.  I also tried an epigastric approach while the abdomen was insufflated, but I could not obtain adequate visualization.  I elected to proceed with an open procedure.  A right upper quadrant incision was made along the costal margin.  The oblique muscles were divided using Bovie electrocautery.  The peritoneal cavity was entered into without difficulty.  The liver was inspected  and noted to be within normal limits.  On inspection of the small bowel, a small amount of air was noted in the mesentery, but there was no apparent bowel injury or perforation.  The gallbladder was significantly distended.  A retrograde dome down approach was then used using Bovie electrocautery.  The dissection was taken down to the infundibulum of the gallbladder.  The triangle of Calot was fully identified.  The cystic duct was first identified.  Its juncture to the infundibulum was fully identified.  Multiple clips were placed proximally and distally on the cystic duct.  An 0 silk tie was also placed distally on the cystic duct.  The cystic duct was divided.  The cystic artery was ligated and divided using clips.  The gallbladder was then removed from the operative field and sent to pathology for the examination.  Right upper quadrant was copiously irrigated with normal saline.  Surgicel was placed in the gallbladder fossa.  The posterior fascial layer was reapproximated using a running 0 Vicryl suture.  The anterior rectus sheath was reapproximated using 0 Vicryl interrupted sutures.  The wounds were irrigated with normal saline.  Exparel was instilled into all wounds.  All skin incisions were closed using staples.  Betadine ointment and dry sterile dressings were applied.  All tape and needle counts were correct at the end of the procedure.  The patient was extubated in the operating room and transferred to PACU in stable condition.  Complications: Unable to complete laparoscopically  EBL: Minimal  Specimen: Gallbladder

## 2018-06-17 NOTE — Anesthesia Postprocedure Evaluation (Signed)
Anesthesia Post Note  Patient: Brandon [name]  Procedure(s) Performed: ATTEMPTED LAPAROSCOPIC CHOLECYSTECTOMY, (N/A Abdomen) CHOLECYSTECTOMY (N/A Abdomen)  Patient location during evaluation: PACU Anesthesia Type: General Level of consciousness: awake and alert and oriented Pain management: pain level controlled Vital Signs Assessment: post-procedure vital signs reviewed and stable Respiratory status: spontaneous breathing Cardiovascular status: blood pressure returned to baseline and stable Postop Assessment: no apparent nausea or vomiting Anesthetic complications: no     Last Vitals:  Vitals:   06/17/18 1423 06/17/18 1430  BP: (!) 150/85 (!) 170/150  Pulse: 86 81  Resp: 16 16  Temp: 36.8 C   SpO2: 100% 100%    Last Pain:  Vitals:   06/17/18 1117  TempSrc: Oral  PainSc: 0-No pain                 Marquay Kruse

## 2018-06-17 NOTE — Anesthesia Procedure Notes (Signed)
Procedure Name: Intubation Date/Time: 06/17/2018 1:07 PM Performed by: Vista Deck, CRNA Pre-anesthesia Checklist: Patient identified, Patient being monitored, Timeout performed, Emergency Drugs available and Suction available Patient Re-evaluated:Patient Re-evaluated prior to induction Oxygen Delivery Method: Circle System Utilized Preoxygenation: Pre-oxygenation with 100% oxygen Induction Type: IV induction Ventilation: Mask ventilation without difficulty Laryngoscope Size: Mac and 3 Grade View: Grade II Tube type: Oral Tube size: 7.0 mm Number of attempts: 1 Airway Equipment and Method: stylet Placement Confirmation: ETT inserted through vocal cords under direct vision,  positive ETCO2 and breath sounds checked- equal and bilateral Secured at: 21 cm Tube secured with: Tape Dental Injury: Teeth and Oropharynx as per pre-operative assessment

## 2018-06-17 NOTE — Transfer of Care (Signed)
Immediate Anesthesia Transfer of Care Note  Patient: Brandon [name]  Procedure(s) Performed: ATTEMPTED LAPAROSCOPIC CHOLECYSTECTOMY, (N/A Abdomen) CHOLECYSTECTOMY (N/A Abdomen)  Patient Location: PACU  Anesthesia Type:General  Level of Consciousness: awake and alert   Airway & Oxygen Therapy: Patient Spontanous Breathing  Post-op Assessment: Report given to RN  Post vital signs: Reviewed and stable  Last Vitals:  Vitals Value Taken Time  BP    Temp    Pulse 88 06/17/2018  2:23 PM  Resp 14 06/17/2018  2:23 PM  SpO2 100 % 06/17/2018  2:23 PM  Vitals shown include unvalidated device data.  Last Pain:  Vitals:   06/17/18 1117  TempSrc: Oral  PainSc: 0-No pain      Patients Stated Pain Goal: 6 (87/19/94 1290)  Complications: No apparent anesthesia complications

## 2018-06-17 NOTE — Progress Notes (Signed)
Patient headed to surgery. Lovenox given.

## 2018-06-17 NOTE — Care Management Important Message (Signed)
Important Message  Patient Details  Name: Brandon [name] MRN: [id number] Date of Birth: 1941-09-08   Medicare Important Message Given:  Yes    Shelda Altes 06/17/2018, 11:03 AM

## 2018-06-17 NOTE — Progress Notes (Signed)
PROGRESS NOTE    Brandon [name]  LTJ:[id number] DOB: 01/01/1941 DOA: 06/14/2018 PCP: Monico Blitz, MD   Brief Narrative:   Brandon [name] a 77 y.o.male79 y/o with PMH significant for colon cancer s/p descending colectomy (in remission), GERD, Gout, HLD and HTN; who came to ED as direct admission from Dr, Arnoldo Morale office due to concerns for cholelithiasis with choledocholithiasis.He was noted to have intermittent episodes of abdominal pain and was noted to be jaundiced as well. MRCP revealed common bile duct stone with dilation.  Patient underwent ERCP with stent placement on 7/31 with plans to undergo cholecystectomy this a.m.   Assessment & Plan:   Principal Problem:   Cholelithiasis with choledocholithiasis Active Problems:   Colon cancer ([place])   HTN (hypertension)   GERD (gastroesophageal reflux disease)   Jaundice   HLD (hyperlipidemia)  1. Jaundice secondary to cholelithiasis with choledocholithiasis-no active symptoms currently.   He is status post ERCP with stent placement, but stone was impacted.  Plan for cholecystectomy this a.m. per general surgery. 2. Hypertension-stable. Stable and well-controlled. Not currently on any medications. 3. GERD. Continue PPI. 4. Dyslipidemia. Hold statins in the setting of elevated LFTs. 5. History of colon cancer-currently in remission. 6. History of CAD-nonobstructive. Continue aspirin and resume statins when liver function returns back to normal.   DVT prophylaxis:SCDs Code Status: Full Family Communication: None at bedside Disposition Plan: CCY today   Consultants:   GI   General Surgery  Procedures:   ERCP with stent placement on 7/31  Antimicrobials:   Rocephin 7/31->   Subjective: Patient seen and evaluated today with no new acute complaints or concerns. No acute concerns or events noted overnight.  Objective: Vitals:   06/16/18 0558 06/16/18 1428 06/16/18 2217 06/17/18 0700  BP: 124/74 (!)  124/56 118/66 (!) 142/66  Pulse: 71 60 62 63  Resp: 20 16 18 18   Temp: 98.2 F (36.8 C) 97.7 F (36.5 C) 98.1 F (36.7 C) 98 F (36.7 C)  TempSrc: Oral Oral Oral Oral  SpO2: 99% 100% 99% 97%  Weight:      Height:        Intake/Output Summary (Last 24 hours) at 06/17/2018 1039 Last data filed at 06/17/2018 0444 Gross per 24 hour  Intake 2560.42 ml  Output 850 ml  Net 1710.42 ml   Filed Weights   06/14/18 1121 06/15/18 1142  Weight: 83 kg (183 lb) 83 kg (183 lb)    Examination:  General exam: Appears calm and comfortable  Respiratory system: Clear to auscultation. Respiratory effort normal. Cardiovascular system: S1 & S2 heard, RRR. No JVD, murmurs, rubs, gallops or clicks. No pedal edema. Gastrointestinal system: Abdomen is nondistended, soft and nontender. No organomegaly or masses felt. Normal bowel sounds heard. Central nervous system: Alert and oriented. No focal neurological deficits. Extremities: Symmetric 5 x 5 power. Skin: No rashes, lesions or ulcers Psychiatry: Judgement and insight appear normal. Mood & affect appropriate.     Data Reviewed: I have personally reviewed following labs and imaging studies  CBC: Recent Labs  Lab 06/14/18 1716 06/15/18 0422 06/15/18 1603 06/16/18 0354 06/17/18 0502  WBC 9.2 5.5 8.4 6.9 5.3  NEUTROABS 7.0  --   --   --   --   HGB 11.8* 11.1* 12.2* 11.2* 11.7*  HCT 35.3* 33.1* 36.2* 33.2* 35.3*  MCV 98.9 98.5 98.9 98.2 98.6  PLT 231 202 203 191 076   Basic Metabolic Panel: Recent Labs  Lab 06/14/18 1716 06/14/18 1754 06/15/18 0422  06/15/18 1521 06/16/18 0354 06/17/18 0502  NA 137  --  138 138 137 139  K 4.2  --  4.2 4.1 4.1 4.5  CL 104  --  106 106 106 107  CO2 27  --  26 27 27 26   GLUCOSE 131*  --  98 111* 103* 104*  BUN 10  --  8 10 8  <5*  CREATININE 0.91 0.80 0.83 0.92 0.74 0.74  CALCIUM 9.5  --  9.3 9.1 9.1 9.3  MG 1.8  --   --   --   --   --   PHOS 2.6  --   --   --   --   --    GFR: Estimated  Creatinine Clearance: 82.4 mL/min (by C-G formula based on SCr of 0.74 mg/dL). Liver Function Tests: Recent Labs  Lab 06/14/18 1716 06/15/18 1521 06/16/18 0354 06/17/18 0502  AST 127* 70* 52* 30  ALT 79* 59* 49* 36  ALKPHOS 909* 686* 602* 515*  BILITOT 3.7* 3.2* 2.6* 2.0*  PROT 6.4* 5.6* 5.5* 5.6*  ALBUMIN 3.1* 2.8* 2.6* 2.7*   Recent Labs  Lab 06/15/18 1521 06/16/18 0354 06/17/18 0502  AMYLASE 123* 1,558* 204*   No results for input(s): AMMONIA in the last 168 hours. Coagulation Profile: No results for input(s): INR, PROTIME in the last 168 hours. Cardiac Enzymes: No results for input(s): CKTOTAL, CKMB, CKMBINDEX, TROPONINI in the last 168 hours. BNP (last 3 results) No results for input(s): PROBNP in the last 8760 hours. HbA1C: No results for input(s): HGBA1C in the last 72 hours. CBG: No results for input(s): GLUCAP in the last 168 hours. Lipid Profile: No results for input(s): CHOL, HDL, LDLCALC, TRIG, CHOLHDL, LDLDIRECT in the last 72 hours. Thyroid Function Tests: No results for input(s): TSH, T4TOTAL, FREET4, T3FREE, THYROIDAB in the last 72 hours. Anemia Panel: No results for input(s): VITAMINB12, FOLATE, FERRITIN, TIBC, IRON, RETICCTPCT in the last 72 hours. Sepsis Labs: No results for input(s): PROCALCITON, LATICACIDVEN in the last 168 hours.  Recent Results (from the past 240 hour(s))  Surgical pcr screen     Status: None   Collection Time: 06/16/18 12:04 PM  Result Value Ref Range Status   MRSA, PCR NEGATIVE NEGATIVE Final   Staphylococcus aureus NEGATIVE NEGATIVE Final    Comment: (NOTE) The Xpert SA Assay (FDA approved for NASAL specimens in patients 59 years of age and older), is one component of a comprehensive surveillance program. It is not intended to diagnose infection nor to guide or monitor treatment. Performed at [facility], [address] [place], [place] [address]          Radiology Studies: Dg Ercp Biliary & Pancreatic  Ducts  Result Date: 06/15/2018 CLINICAL DATA:  Common duct obstruction EXAM: ERCP TECHNIQUE: Multiple spot images obtained with the fluoroscopic device and submitted for interpretation post-procedure. FLUOROSCOPY TIME:  Fluoroscopy Time:  2 minutes and 28 seconds Radiation Exposure Index (if provided by the fluoroscopic device): 68.62 mGy Number of Acquired Spot Images: 0 COMPARISON:  None. FINDINGS: Balloon stone retrieval in the common bile duct is documented. A biliary stent is seen across the distal common bile duct on the final image. IMPRESSION: See above. These images were submitted for radiologic interpretation only. Please see the procedural report for the amount of contrast and the fluoroscopy time utilized. Electronically Signed   By: Marybelle Killings M.D.   On: 06/15/2018 15:53        Scheduled Meds: . Chlorhexidine Gluconate Cloth  6 each Topical Once  . enoxaparin (LOVENOX) injection  40 mg Subcutaneous Once  . pantoprazole  40 mg Oral Daily   Continuous Infusions: . sodium chloride 75 mL/hr at 06/17/18 0440  . cefTRIAXone (ROCEPHIN)  IV Stopped (06/16/18 1035)     LOS: 3 days    Time spent: 30 minutes    Brandon Bickle Darleen Crocker, DO [facility] Pager [phone]  If 7PM-7AM, please contact night-coverage [email or web address] Password [facility] 06/17/2018, 10:39 AM

## 2018-06-17 NOTE — Progress Notes (Signed)
  Subjective:  Patient complains of mild pain at RLQ.  He still does not have an appetite.  He denies nausea or vomiting.  At times he has gurgling in epigastric region.  Objective: Blood pressure (!) 147/77, pulse 70, temperature 97.7 F (36.5 C), temperature source Oral, resp. rate 20, height 5\' 10"  (1.778 m), weight 183 lb (83 kg), SpO2 100 %. Patient is alert and in no acute distress.  Labs/studies Results:  Recent Labs    06-16-18 1603 06/16/18 0354 06/17/18 0502  WBC 8.4 6.9 5.3  HGB 12.2* 11.2* 11.7*  HCT 36.2* 33.2* 35.3*  PLT 203 191 209    BMET  Recent Labs    06/16/2018 1521 06/16/18 0354 06/17/18 0502  NA 138 137 139  K 4.1 4.1 4.5  CL 106 106 107  CO2 27 27 26   GLUCOSE 111* 103* 104*  BUN 10 8 <5*  CREATININE 0.92 0.74 0.74  CALCIUM 9.1 9.1 9.3    LFT  Recent Labs    June 16, 2018 1521 06/16/18 0354 06/17/18 0502  PROT 5.6* 5.5* 5.6*  ALBUMIN 2.8* 2.6* 2.7*  AST 70* 52* 30  ALT 59* 49* 36  ALKPHOS 686* 602* 515*  BILITOT 3.2* 2.6* 2.0*    Serum amylase 204.  Assessment:  #1.  Choledocholithiasis.  Patient has impacted stone in distal CBD.  He underwent ERCP 2 days ago with biliary stenting as a stone could not be caught with Dormia basket or dislodged.  Continued improvement in bilirubin alkaline phosphatase and transaminases. Serum amylase has decreased significantly in the last 24 hours.  He develop post ERCP mL ischemia without evidence of pancreatitis. Patient for cholecystectomy today.  #2.  Weight loss of over 70 pounds presumed to be voluntary but I am not convinced.  Will wait and see.

## 2018-06-17 NOTE — Anesthesia Preprocedure Evaluation (Signed)
Anesthesia Evaluation  Patient identified by MRN, date of birth, ID band Patient awake    Reviewed: Allergy & Precautions, H&P , NPO status , Patient's Chart, lab work & pertinent test results, reviewed documented beta blocker date and time   Airway Mallampati: II  TM Distance: >3 FB Neck ROM: full    Dental no notable dental hx. (+) Teeth Intact, Dental Advidsory Given   Pulmonary neg pulmonary ROS, former smoker,    Pulmonary exam normal breath sounds clear to auscultation       Cardiovascular Exercise Tolerance: Good hypertension, On Medications negative cardio ROS   Rhythm:regular Rate:Normal     Neuro/Psych negative neurological ROS  negative psych ROS   GI/Hepatic negative GI ROS, Neg liver ROS, GERD  ,  Endo/Other  negative endocrine ROS  Renal/GU negative Renal ROS  negative genitourinary   Musculoskeletal   Abdominal   Peds  Hematology negative hematology ROS (+)   Anesthesia Other Findings ERCP on 7/31 with retained stone, stent for lap chole today Denies any anesthesia issues on 7/31  Reproductive/Obstetrics negative OB ROS                             Anesthesia Physical Anesthesia Plan  ASA: III  Anesthesia Plan: General   Post-op Pain Management:    Induction:   PONV Risk Score and Plan:   Airway Management Planned:   Additional Equipment:   Intra-op Plan:   Post-operative Plan:   Informed Consent: I have reviewed the patients History and Physical, chart, labs and discussed the procedure including the risks, benefits and alternatives for the proposed anesthesia with the patient or authorized representative who has indicated his/her understanding and acceptance.   Dental Advisory Given  Plan Discussed with: CRNA and Anesthesiologist  Anesthesia Plan Comments:         Anesthesia Quick Evaluation

## 2018-06-18 LAB — AMYLASE: Amylase: 66 U/L (ref 28–100)

## 2018-06-18 LAB — CBC
HCT: 36.4 % — ABNORMAL LOW (ref 39.0–52.0)
Hemoglobin: 11.9 g/dL — ABNORMAL LOW (ref 13.0–17.0)
MCH: 32.3 pg (ref 26.0–34.0)
MCHC: 32.7 g/dL (ref 30.0–36.0)
MCV: 98.9 fL (ref 78.0–100.0)
Platelets: 210 10*3/uL (ref 150–400)
RBC: 3.68 MIL/uL — ABNORMAL LOW (ref 4.22–5.81)
RDW: 13.7 % (ref 11.5–15.5)
WBC: 8.1 10*3/uL (ref 4.0–10.5)

## 2018-06-18 LAB — COMPREHENSIVE METABOLIC PANEL
ALT: 32 U/L (ref 0–44)
AST: 32 U/L (ref 15–41)
Albumin: 2.5 g/dL — ABNORMAL LOW (ref 3.5–5.0)
Alkaline Phosphatase: 426 U/L — ABNORMAL HIGH (ref 38–126)
Anion gap: 8 (ref 5–15)
BUN: 7 mg/dL — ABNORMAL LOW (ref 8–23)
CO2: 25 mmol/L (ref 22–32)
Calcium: 9.2 mg/dL (ref 8.9–10.3)
Chloride: 103 mmol/L (ref 98–111)
Creatinine, Ser: 0.75 mg/dL (ref 0.61–1.24)
GFR calc Af Amer: >60 mL/min (ref >60)
GFR calc non Af Amer: >60 mL/min (ref >60)
Glucose, Bld: 92 mg/dL (ref 70–99)
POTASSIUM: 5 mmol/L (ref 3.5–5.1)
Sodium: 136 mmol/L (ref 135–145)
TOTAL PROTEIN: 5.6 g/dL — AB (ref 6.5–8.1)
Total Bilirubin: 2.2 mg/dL — ABNORMAL HIGH (ref 0.3–1.2)

## 2018-06-18 NOTE — Plan of Care (Signed)
progressing 

## 2018-06-18 NOTE — Progress Notes (Signed)
1 Day Post-Op  Subjective: Patient having some lower abdominal pain with pressure.  Has a sensation that he needs to void.  Has been passing gas.  No bowel movement yet.  Objective: Vital signs in last 24 hours: Temp:  [97.4 F (36.3 C)-98.3 F (36.8 C)] 97.4 F (36.3 C) (08/03 0405) Pulse Rate:  [61-86] 65 (08/03 0405) Resp:  [16-22] 18 (08/03 0405) BP: (104-170)/(49-150) 126/61 (08/03 0405) SpO2:  [95 %-100 %] 100 % (08/03 0405) Weight:  [183 lb (83 kg)] 183 lb (83 kg) (08/02 1117) Last BM Date: 06/14/18  Intake/Output from previous day: 08/02 0701 - 08/03 0700 In: 1700 [I.V.:1700] Out: 0  Intake/Output this shift: Total I/O In: -  Out: 600 [Urine:600]  General appearance: alert, cooperative and no distress Resp: clear to auscultation bilaterally Cardio: regular rate and rhythm, S1, S2 normal, no murmur, click, rub or gallop GI: Soft with incisional tenderness noted.  No rigidity noted.  Minimal bowel sounds appreciated.  Dressing is dry and intact.  Lab Results:  Recent Labs    06/17/18 0502 06/18/18 0609  WBC 5.3 8.1  HGB 11.7* 11.9*  HCT 35.3* 36.4*  PLT 209 210   BMET Recent Labs    06/17/18 0502 06/18/18 0609  NA 139 136  K 4.5 5.0  CL 107 103  CO2 26 25  GLUCOSE 104* 92  BUN <5* 7*  CREATININE 0.74 0.75  CALCIUM 9.3 9.2   PT/INR No results for input(s): LABPROT, INR in the last 72 hours.  Studies/Results: No results found.  Anti-infectives: Anti-infectives (From admission, onward)   Start     Dose/Rate Route Frequency Ordered Stop   06/15/18 1115  cefTRIAXone (ROCEPHIN) 1 g in sodium chloride 0.9 % 100 mL IVPB  Status:  Discontinued     1 g 200 mL/hr over 30 Minutes Intravenous Every 24 hours 06/15/18 1103 06/15/18 1110   06/15/18 1115  cefTRIAXone (ROCEPHIN) 2 g in sodium chloride 0.9 % 100 mL IVPB     2 g 200 mL/hr over 30 Minutes Intravenous Every 24 hours 06/15/18 1111        Assessment/Plan: s/p Procedure(s): ATTEMPTED  LAPAROSCOPIC CHOLECYSTECTOMY, CHOLECYSTECTOMY Impression: Stable on postoperative day 1.  Patient tolerating clear liquid diet.  Will advance to full liquid diet.  Will start ambulating patient.  Awaiting full return of bowel function.  Labs reviewed.  LOS: 4 days    Aviva Signs 06/18/2018

## 2018-06-18 NOTE — Progress Notes (Signed)
PROGRESS NOTE    Brandon [name]  ERD:[id number] DOB: 1941-06-08 DOA: 06/14/2018 PCP: Monico Blitz, MD   Brief Narrative:   Brandon Melone Armstrongis a 77 y.o.male23 y/o with PMH significant for colon cancer s/p descending colectomy (in remission), GERD, Gout, HLD and HTN; who came to ED as direct admission from Dr, Arnoldo Morale office due to concerns for cholelithiasis with choledocholithiasis.He was noted to have intermittent episodes of abdominal pain and was noted to be jaundiced as well. MRCPrevealed common bile duct stone with dilation. Patient underwent ERCP with stent placement on 7/31 followed by open cholecystectomy on 8/2.   Assessment & Plan:   Principal Problem:   Cholelithiasis with choledocholithiasis Active Problems:   Colon cancer ([place])   HTN (hypertension)   GERD (gastroesophageal reflux disease)   Jaundice   HLD (hyperlipidemia)  1. Jaundice secondary to cholelithiasis with choledocholithiasis-no active symptoms currently.He is status post ERCP and now open cholecystectomy.  He is tolerating clear liquid diet.  Further diet progression per primary team. 2. Hypertension-stable. Stable and well-controlled. Not currently on any medications. 3. GERD. Continue PPI. 4. Dyslipidemia. Hold statins in the setting of elevated LFTs. 5. History of colon cancer-currently in remission. 6. History of CAD-nonobstructive. Continue aspirin and resume statins when liver function returns back to normal.   DVT prophylaxis:SCDs Code Status: Full Family Communication: None at bedside Disposition Plan: Sign off from IM standpoint. Patient to go to [facility] service.   Consultants:   GI   General Surgery  Procedures:   ERCP with stent placement on 7/31  Open cholecystectomy on 8/2  Antimicrobials:   Rocephin 7/31->   Subjective: Patient seen and evaluated today with no new acute complaints or concerns. No acute concerns or events noted overnight.  He has  undergone open cholecystectomy with some minimal pain noted today.  He is passing flatus and tolerating clear liquid diet.  He has had minimal urine output noted.  Objective: Vitals:   06/17/18 1552 06/17/18 2009 06/18/18 0011 06/18/18 0405  BP: (!) 112/49 (!) 120/58 (!) 113/53 126/61  Pulse: 66 69 61 65  Resp:  18 18 18   Temp: (!) 97.4 F (36.3 C) 97.7 F (36.5 C) 98.1 F (36.7 C) (!) 97.4 F (36.3 C)  TempSrc: Oral Oral Oral Oral  SpO2: 100% 100% 100% 100%  Weight:      Height:        Intake/Output Summary (Last 24 hours) at 06/18/2018 1011 Last data filed at 06/18/2018 0958 Gross per 24 hour  Intake 1700 ml  Output 600 ml  Net 1100 ml   Filed Weights   06/14/18 1121 06/15/18 1142 06/17/18 1117  Weight: 83 kg (183 lb) 83 kg (183 lb) 83 kg (183 lb)    Examination:  General exam: Appears calm and comfortable  Respiratory system: Clear to auscultation. Respiratory effort normal.  On nasal cannula. Cardiovascular system: S1 & S2 heard, RRR. No JVD, murmurs, rubs, gallops or clicks. No pedal edema. Gastrointestinal system: Abdomen is nondistended, soft and nontender. No organomegaly or masses felt. Normal bowel sounds heard.  Dressings clean dry and intact. Central nervous system: Alert and oriented. No focal neurological deficits. Extremities: Symmetric 5 x 5 power. Skin: No rashes, lesions or ulcers Psychiatry: Judgement and insight appear normal. Mood & affect appropriate.     Data Reviewed: I have personally reviewed following labs and imaging studies  CBC: Recent Labs  Lab 06/14/18 1716 06/15/18 0422 06/15/18 1603 06/16/18 0354 06/17/18 0502 06/18/18 0609  WBC 9.2 5.5  8.4 6.9 5.3 8.1  NEUTROABS 7.0  --   --   --   --   --   HGB 11.8* 11.1* 12.2* 11.2* 11.7* 11.9*  HCT 35.3* 33.1* 36.2* 33.2* 35.3* 36.4*  MCV 98.9 98.5 98.9 98.2 98.6 98.9  PLT 231 202 203 191 209 277   Basic Metabolic Panel: Recent Labs  Lab 06/14/18 1716  06/15/18 0422 06/15/18 1521  06/16/18 0354 06/17/18 0502 06/18/18 0609  NA 137  --  138 138 137 139 136  K 4.2  --  4.2 4.1 4.1 4.5 5.0  CL 104  --  106 106 106 107 103  CO2 27  --  26 27 27 26 25   GLUCOSE 131*  --  98 111* 103* 104* 92  BUN 10  --  8 10 8  <5* 7*  CREATININE 0.91   < > 0.83 0.92 0.74 0.74 0.75  CALCIUM 9.5  --  9.3 9.1 9.1 9.3 9.2  MG 1.8  --   --   --   --   --   --   PHOS 2.6  --   --   --   --   --   --    < > = values in this interval not displayed.   GFR: Estimated Creatinine Clearance: 82.4 mL/min (by C-G formula based on SCr of 0.75 mg/dL). Liver Function Tests: Recent Labs  Lab 06/14/18 1716 06/15/18 1521 06/16/18 0354 06/17/18 0502 06/18/18 0609  AST 127* 70* 52* 30 32  ALT 79* 59* 49* 36 32  ALKPHOS 909* 686* 602* 515* 426*  BILITOT 3.7* 3.2* 2.6* 2.0* 2.2*  PROT 6.4* 5.6* 5.5* 5.6* 5.6*  ALBUMIN 3.1* 2.8* 2.6* 2.7* 2.5*   Recent Labs  Lab 06/15/18 1521 06/16/18 0354 06/17/18 0502 06/18/18 0609  AMYLASE 123* 1,558* 204* 66   No results for input(s): AMMONIA in the last 168 hours. Coagulation Profile: No results for input(s): INR, PROTIME in the last 168 hours. Cardiac Enzymes: No results for input(s): CKTOTAL, CKMB, CKMBINDEX, TROPONINI in the last 168 hours. BNP (last 3 results) No results for input(s): PROBNP in the last 8760 hours. HbA1C: No results for input(s): HGBA1C in the last 72 hours. CBG: No results for input(s): GLUCAP in the last 168 hours. Lipid Profile: No results for input(s): CHOL, HDL, LDLCALC, TRIG, CHOLHDL, LDLDIRECT in the last 72 hours. Thyroid Function Tests: No results for input(s): TSH, T4TOTAL, FREET4, T3FREE, THYROIDAB in the last 72 hours. Anemia Panel: No results for input(s): VITAMINB12, FOLATE, FERRITIN, TIBC, IRON, RETICCTPCT in the last 72 hours. Sepsis Labs: No results for input(s): PROCALCITON, LATICACIDVEN in the last 168 hours.  Recent Results (from the past 240 hour(s))  Surgical pcr screen     Status: None    Collection Time: 06/16/18 12:04 PM  Result Value Ref Range Status   MRSA, PCR NEGATIVE NEGATIVE Final   Staphylococcus aureus NEGATIVE NEGATIVE Final    Comment: (NOTE) The Xpert SA Assay (FDA approved for NASAL specimens in patients 66 years of age and older), is one component of a comprehensive surveillance program. It is not intended to diagnose infection nor to guide or monitor treatment. Performed at [facility], [address], [place], [place] [address]          Radiology Studies: No results found.      Scheduled Meds: . enoxaparin (LOVENOX) injection  40 mg Subcutaneous Q24H  . pantoprazole  40 mg Oral Daily   Continuous Infusions: . sodium chloride  75 mL/hr at 06/18/18 0622  . cefTRIAXone (ROCEPHIN)  IV Stopped (06/17/18 1526)     LOS: 4 days    Time spent: 30 minutes    Paisley Grajeda Darleen Crocker, DO [facility] Pager [phone]  If 7PM-7AM, please contact night-coverage [email or web address] Password [email or web address] 06/18/2018, 10:11 AM

## 2018-06-19 LAB — COMPREHENSIVE METABOLIC PANEL
ALBUMIN: 2.4 g/dL — AB (ref 3.5–5.0)
ALT: 23 U/L (ref 0–44)
ANION GAP: 3 — AB (ref 5–15)
AST: 22 U/L (ref 15–41)
Alkaline Phosphatase: 327 U/L — ABNORMAL HIGH (ref 38–126)
BILIRUBIN TOTAL: 1.5 mg/dL — AB (ref 0.3–1.2)
BUN: 7 mg/dL — ABNORMAL LOW (ref 8–23)
CALCIUM: 8.8 mg/dL — AB (ref 8.9–10.3)
CO2: 28 mmol/L (ref 22–32)
Chloride: 105 mmol/L (ref 98–111)
Creatinine, Ser: 0.72 mg/dL (ref 0.61–1.24)
GFR calc non Af Amer: >60 mL/min (ref >60)
GLUCOSE: 110 mg/dL — AB (ref 70–99)
POTASSIUM: 4.2 mmol/L (ref 3.5–5.1)
SODIUM: 136 mmol/L (ref 135–145)
TOTAL PROTEIN: 5.2 g/dL — AB (ref 6.5–8.1)

## 2018-06-19 LAB — CBC
HEMATOCRIT: 34.5 % — AB (ref 39.0–52.0)
HEMOGLOBIN: 11.5 g/dL — AB (ref 13.0–17.0)
MCH: 32.3 pg (ref 26.0–34.0)
MCHC: 33.3 g/dL (ref 30.0–36.0)
MCV: 96.9 fL (ref 78.0–100.0)
Platelets: 221 10*3/uL (ref 150–400)
RBC: 3.56 MIL/uL — ABNORMAL LOW (ref 4.22–5.81)
RDW: 13.7 % (ref 11.5–15.5)
WBC: 7.9 10*3/uL (ref 4.0–10.5)

## 2018-06-19 MED ORDER — TRAMADOL HCL 50 MG PO TABS: 50.0000 mg | ORAL_TABLET | Freq: Four times a day (QID) | ORAL | 0 refills | Status: DC | PRN

## 2018-06-19 NOTE — Discharge Summary (Signed)
Physician Discharge Summary  Patient ID: Brandon [name] MRN: [id number] DOB/AGE: Mar 18, 1941 77 y.o.  Admit date: 06/14/2018 Discharge date: 06/19/2018  Admission Diagnoses: Cholecystitis, cholelithiasis, choledocholithiasis  Discharge Diagnoses: Same Principal Problem:   Cholelithiasis with choledocholithiasis Active Problems:   Colon cancer ([place])   HTN (hypertension)   GERD (gastroesophageal reflux disease)   Jaundice   HLD (hyperlipidemia)   Discharged Condition: good  Hospital Course: Patient is a 77 year old white male who presented to my office with mild jaundice and history of cholelithiasis.  He was admitted to [facility] on 06/14/2018.  MRCP revealed choledocholithiasis.  The patient subsequently underwent an ERCP with stent placement by Dr. Laural Golden on 06/15/2018.  He was unable to remove the stone, but did leave a stent.  He had a chemical pancreatitis after the procedure, but this resolved quickly.  He subsequently underwent an open cholecystectomy on 06/17/2018.  Laparoscopic approach had to be aborted due to inability to adequately insufflate the abdomen.  He tolerated the procedure well.  His postoperative course has been unremarkable.  His diet was advanced without difficulty.  His elevated liver enzyme tests have been improving.  He is being discharged home on 06/19/2018 in good and improving condition.  Treatments: surgery: ERCP with stent placement on 06/15/2018 Open cholecystectomy on 06/17/2018  Discharge Exam: Blood pressure 121/87, pulse 72, temperature 98.5 F (36.9 C), temperature source Oral, resp. rate 18, height 5\' 10"  (1.778 m), weight 183 lb (83 kg), SpO2 93 %. General appearance: alert, cooperative and no distress Resp: clear to auscultation bilaterally Cardio: regular rate and rhythm, S1, S2 normal, no murmur, click, rub or gallop GI: Soft, incisions healing well.  Bowel sounds active.  Disposition: Discharge disposition: 01-Home or Self  Care       Discharge Instructions    Diet - low sodium heart healthy   Complete by:  As directed    Increase activity slowly   Complete by:  As directed      Allergies as of 06/19/2018      Reactions   Oxycodone    Oxycontin [oxycodone Hcl]    Hallucinations      Medication List    TAKE these medications   aspirin 325 MG tablet Take 325 mg by mouth daily.   clobetasol 0.05 % topical foam Commonly known as:  OLUX Apply topically 2 (two) times daily.   famotidine 10 MG tablet Commonly known as:  PEPCID Take 10 mg by mouth 2 (two) times daily as needed for heartburn or indigestion.   MILK OF MAGNESIA 400 MG/5ML suspension Generic drug:  magnesium hydroxide Take by mouth daily as needed for mild constipation.   nitroGLYCERIN 0.4 MG SL tablet Commonly known as:  NITROSTAT Place 0.4 mg under the tongue every 5 (five) minutes as needed for chest pain.   simvastatin 20 MG tablet Commonly known as:  ZOCOR Take 20 mg by mouth at bedtime.   traMADol 50 MG tablet Commonly known as:  ULTRAM Take 1 tablet (50 mg total) by mouth every 6 (six) hours as needed.      Follow-up Information    Aviva Signs, MD. Schedule an appointment as soon as possible for a visit on 06/30/2018.   Specialty:  General Surgery Contact information: [address] [place] [address] [phone]           Signed: Aviva Signs 06/19/2018, 9:11 AM

## 2018-06-19 NOTE — Progress Notes (Signed)
Removed IV-clean, dry, intact. Reviewed d/c paperwork with patient and son. Reviewed instructions on aftercare and to call Dr. Arnoldo Morale for a follow-up. Answered all questions. Wheeled stable patient and belongings to main entrance where he was picked up by son.

## 2018-06-19 NOTE — Discharge Instructions (Signed)
Open Cholecystectomy, Care After This sheet gives you information about how to care for yourself after your procedure. Your health care provider may also give you more specific instructions. If you have problems or questions, contact your health care provider. What can I expect after the procedure? After the procedure, it is common to have:  Pain at your incision site. You will be given medicines to control this pain.  Mild nausea or vomiting.  Follow these instructions at home: Incision care   Follow instructions from your health care provider about how to take care of your incision. Make sure you: ? Wash your hands with soap and water before you change your bandage (dressing). If soap and water are not available, use hand sanitizer. ? Change your dressing as told by your health care provider. ? Leave stitches (sutures), skin glue, or adhesive strips in place. These skin closures may need to be in place for 2 weeks or longer. If adhesive strip edges start to loosen and curl up, you may trim the loose edges. Do not remove adhesive strips completely unless your health care provider tells you to do that.  Do not take baths, swim, or use a hot tub until your health care provider approves. Ask your health care provider if you can take showers. You may only be allowed to take sponge baths for bathing.  Check your incision area every day for signs of infection. Check for: ? More redness, swelling, or pain. ? More fluid or blood. ? Warmth. ? Pus or a bad smell. Activity  Do not drive or use heavy machinery while taking prescription pain medicine.  Do not lift anything that is heavier than 10 lb (4.5 kg) until your health care provider approves.  Do not play contact sports until your health care provider approves.  Do not drive for 24 hours if you were given a medicine to help you relax (sedative).  Rest as needed. Do not return to work or school until your health care provider  approves. General instructions  Take over-the-counter and prescription medicines only as told by your health care provider.  To prevent or treat constipation while you are taking prescription pain medicine, your health care provider may recommend that you: ? Drink enough fluid to keep your urine clear or pale yellow. ? Take over-the-counter or prescription medicines. ? Eat foods that are high in fiber, such as fresh fruits and vegetables, whole grains, and beans. ? Limit foods that are high in fat and processed sugars, such as fried and sweet foods. Contact a health care provider if:  You develop a rash.  You have more redness, swelling, or pain around your incision.  You have more fluid or blood coming from your incision.  Your incision feels warm to the touch.  You have pus or a bad smell coming from your incision.  You have a fever.  Your incision breaks open. Get help right away if:  You have trouble breathing.  You have chest pain.  You have increasing pain in your shoulders.  You faint or feel dizzy when you stand.  You have severe pain in your abdomen.  You have nausea or vomiting that lasts for more than one day.  You have leg pain. This information is not intended to replace advice given to you by your health care provider. Make sure you discuss any questions you have with your health care provider. Document Released: 02/18/2004 Document Revised: 05/23/2016 Document Reviewed: 04/20/2016 Elsevier Interactive Patient Education  2018 [place].

## 2018-06-20 ENCOUNTER — Encounter (HOSPITAL_COMMUNITY): Payer: Self-pay | Admitting: General Surgery

## 2018-06-30 ENCOUNTER — Encounter: Payer: Self-pay | Admitting: General Surgery

## 2018-06-30 ENCOUNTER — Ambulatory Visit (INDEPENDENT_AMBULATORY_CARE_PROVIDER_SITE_OTHER): Payer: Self-pay | Admitting: General Surgery

## 2018-06-30 VITALS — BP 133/74 | HR 86 | Temp 96.9°F | Resp 16 | Wt 183.0 lb

## 2018-06-30 DIAGNOSIS — Z09 Encounter for follow-up examination after completed treatment for conditions other than malignant neoplasm: Secondary | ICD-10-CM

## 2018-06-30 MED ORDER — TAMSULOSIN HCL 0.4 MG PO CAPS: 0.4000 mg | ORAL_CAPSULE | Freq: Every day | ORAL | 1 refills | Status: AC

## 2018-06-30 NOTE — Progress Notes (Signed)
Subjective:     Brandon [name]  Status post open cholecystectomy.  Patient doing very well.  Is pleased with the results.  Is still having some urinary retention issues. Objective:    BP 133/74 (BP Location: Left Arm, Patient Position: Sitting, Cuff Size: Normal)   Pulse 86   Temp (!) 96.9 F (36.1 C) (Temporal)   Resp 16   Wt 183 lb (83 kg)   BMI 26.26 kg/m    General:  alert, cooperative and no distress  Abdomen soft, incisions healing well.  Staples removed, Steri-Strips applied. Final pathology consistent with diagnosis.     Assessment:    Doing well postoperatively. Mild urinary hesitancy    Plan:   Have temporarily prescribed Flomax to help with his urinary hesitancy.  He was instructed to follow-up with his primary care physician for further work-up and treatment.  Follow-up here as needed.

## 2018-07-08 ENCOUNTER — Telehealth (INDEPENDENT_AMBULATORY_CARE_PROVIDER_SITE_OTHER): Payer: Self-pay | Admitting: Internal Medicine

## 2018-07-08 NOTE — Telephone Encounter (Signed)
Patient was called and made aware that Dr.Rehman was out of the office until Tuesday and that we will address this message with him then.

## 2018-07-08 NOTE — Telephone Encounter (Signed)
Patient called stated he just had his gall bladder removed - Dr Laural Golden had mentioned doing another procedure and he is very much interested in doing that soon - please call him back at [phone]

## 2018-07-12 ENCOUNTER — Telehealth (INDEPENDENT_AMBULATORY_CARE_PROVIDER_SITE_OTHER): Payer: Self-pay | Admitting: Internal Medicine

## 2018-07-12 NOTE — Telephone Encounter (Signed)
Patient would like to talk to you about another procedure that you had previously discussed - please call [phone]

## 2018-07-14 NOTE — Telephone Encounter (Signed)
Patient has appointment for 07/19/2018 at 9:30 to discuss upcoming procedure.

## 2018-07-14 NOTE — Telephone Encounter (Signed)
If patient does not want to come to the office for a visit we can schedule him for ERCP with stent and stone removal.

## 2018-07-19 ENCOUNTER — Other Ambulatory Visit (INDEPENDENT_AMBULATORY_CARE_PROVIDER_SITE_OTHER): Payer: Self-pay | Admitting: *Deleted

## 2018-07-19 ENCOUNTER — Ambulatory Visit (INDEPENDENT_AMBULATORY_CARE_PROVIDER_SITE_OTHER): Payer: Medicare HMO | Admitting: Internal Medicine

## 2018-07-19 ENCOUNTER — Encounter (INDEPENDENT_AMBULATORY_CARE_PROVIDER_SITE_OTHER): Payer: Self-pay | Admitting: Internal Medicine

## 2018-07-19 VITALS — BP 130/84 | HR 62 | Temp 97.6°F | Resp 18 | Ht 67.0 in | Wt 187.2 lb

## 2018-07-19 DIAGNOSIS — K8050 Calculus of bile duct without cholangitis or cholecystitis without obstruction: Secondary | ICD-10-CM | POA: Diagnosis not present

## 2018-07-19 MED ORDER — LOPERAMIDE HCL 2 MG PO TABS: 2.0000 mg | ORAL_TABLET | Freq: Every day | ORAL | 0 refills | Status: AC | PRN

## 2018-07-19 NOTE — Progress Notes (Signed)
Presenting complaint;  Follow-up for choledocholithiasis.  Database and subjective:  Patient is 77 year old Caucasian male who was hospitalized on 06/14/2018 with jaundice cholelithiasis as well as code choledocholithiasis.  He underwent ERCP and found to have large impacted stone which could not be dislodged.  Therefore bile duct was decompressed with plastic stent.  He had open cholecystectomy.  He was discharged on 06/19/2018.  He saw Dr. Arnoldo Morale and had the staples removed.  Patient is here for scheduled visit accompanied by his son Delfino Lovett.  He has no complaints.  He says his appetite is good and he has gained 10 pounds in the last 1 month.  He has noted increased frequency of bowel movement since his surgery.  His baseline was 1 or 2 a day now he is having 3-4.  Most of his stools are formed.  He denies abdominal pain.   Current Medications: Outpatient Encounter Medications as of 07/19/2018  Medication Sig  . aspirin 325 MG tablet Take 325 mg by mouth daily.    . clobetasol (OLUX) 0.05 % topical foam Apply topically 2 (two) times daily.  . famotidine (PEPCID) 10 MG tablet Take 10 mg by mouth 2 (two) times daily as needed for heartburn or indigestion.   . nitroGLYCERIN (NITROSTAT) 0.4 MG SL tablet Place 0.4 mg under the tongue every 5 (five) minutes as needed for chest pain.   . simvastatin (ZOCOR) 20 MG tablet Take 20 mg by mouth at bedtime.   . tamsulosin (FLOMAX) 0.4 MG CAPS capsule Take 1 capsule (0.4 mg total) by mouth daily.  . traMADol (ULTRAM) 50 MG tablet Take 1 tablet (50 mg total) by mouth every 6 (six) hours as needed. (Patient not taking: Reported on 07/19/2018)   No facility-administered encounter medications on file as of 07/19/2018.      Objective: Blood pressure 130/84, pulse 62, temperature 97.6 F (36.4 C), temperature source Oral, resp. rate 18, height 5\' 7"  (1.702 m), weight 187 lb 3.2 oz (84.9 kg). Patient is alert and in no acute distress. Conjunctiva is pink. Sclera  is nonicteric Oropharyngeal mucosa is normal. No neck masses or thyromegaly noted. Cardiac exam with regular rhythm normal S1 and S2. No murmur or gallop noted. Lungs are clear to auscultation. Abdomen is symmetrical with small right subcostal scar.  He also has a long midline scar.  Abdomen is soft and nontender with organomegaly or masses. No LE edema or clubbing noted.    Assessment:  #1.  Choledocholithiasis.  This was discovered when he was hospitalized about 5 weeks ago with jaundice and elevated transaminases.  He had large impacted stone which could not be dislodged and removed.  Therefore bile duct was decompressed with stent.  He went on to have open cholecystectomy.  He is doing well other than he has noted increased frequency of defecation which is not unusual following gallbladder surgery.   Plan:  Imodium OTC 2 mg nightly as needed. We will schedule patient for ERCP with stent and stone removal. He will have CBC and comprehensive chemistry panel prior to the procedure. Office visit on as-needed basis.

## 2018-07-19 NOTE — Patient Instructions (Signed)
ERCP with stent and bone removal to be scheduled. Remember to stop aspirin 2 days prior to procedure. Imodium OTC 1 to 2 mg every morning as needed

## 2018-07-20 ENCOUNTER — Other Ambulatory Visit (INDEPENDENT_AMBULATORY_CARE_PROVIDER_SITE_OTHER): Payer: Self-pay | Admitting: *Deleted

## 2018-07-20 DIAGNOSIS — K8050 Calculus of bile duct without cholangitis or cholecystitis without obstruction: Secondary | ICD-10-CM | POA: Insufficient documentation

## 2018-07-25 NOTE — Patient Instructions (Addendum)
Brandon [name]  07/27/2018     @PREFPERIOPPHARMACY @   Your procedure is scheduled on  08/01/2018   Report to [facility] at  1315   P.M.  Call this number if you have problems the morning of surgery:  [phone]   Remember:  Do not eat or drink after midnight.  You may drink clear liquids until  0900 am 08/01/2018.  Clear liquids allowed are:                    Water, Juice (non-citric and without pulp), Carbonated beverages, Clear Tea, Black Coffee only, Plain Jell-O only, Gatorade and Plain Popsicles only    Take these medicines the morning of surgery with A SIP OF WATER  Ramipril, dexiant, lexapro, synthroid, oxycodone (if needed), zofran ( if needed). May also call [phone] in order to leave a message if needed.    Do not wear jewelry, make-up or nail polish.  Do not wear lotions, powders, or perfumes, or deodorant.  Do not shave 48 hours prior to surgery.  Men may shave face and neck.  Do not bring valuables to the hospital.  [facility] is not responsible for any belongings or valuables.  Contacts, dentures or bridgework may not be worn into surgery.  Leave your suitcase in the car.  After surgery it may be brought to your room.  For patients admitted to the hospital, discharge time will be determined by your treatment team.  Patients discharged the day of surgery will not be allowed to drive home.   Name and phone number of your driver:   Son Special instructions:  Nothing solid to eat after midnight 07/31/2018 and you may have clear liquids until 0900 am 08/01/2018  Please read over the following fact sheets that you were given. Anesthesia Post-op Instructions and Care and Recovery After Surgery                                        Dr Willey Blade- [phone]                                     Dr Luking-[phone]                                     Dr Edwyna Ready [phone]      Endoscopic Retrograde Cholangiopancreatogram Endoscopic retrograde  cholangiopancreatogram (ERCP) is a procedure that may be used to diagnose or treat problems with the pancreas, bile ducts, liver, and gallbladder. For this procedure, a thin, lighted tube (endoscope) is passed through the mouth, the throat, and down into the areas being checked. The endoscope has a camera that allows the areas to be viewed. Dye is injected and then X-rays are taken to further study the areas. During ERCP, other procedures may also be done to help diagnose or treat problems that are found. For example, stones can be removed, or a tissue sample can be taken out for testing (biopsy). Tell a health care provider about:  Any allergies you have.  All medicines you are taking, including vitamins, herbs, eye drops, creams, and over-the-counter medicines.  Any problems you or family members have had with anesthetic medicines.  Any blood  disorders you have.  Any surgeries you have had.  Any medical conditions you have.  Whether you are pregnant or may be pregnant. What are the risks? Generally, this is a safe procedure. However, problems may occur, including:  Pancreatitis.  Infection.  Bleeding.  Allergic reactions to medicines or dyes.  Accidental punctures in the bowel wall, pancreas, or gallbladder.  Damage to other structures or organs.  What happens before the procedure? Staying hydrated Follow instructions from your health care provider about hydration, which may include:  Up to 2 hours before the procedure - you may continue to drink clear liquids, such as water, clear fruit juice, black coffee, and plain tea.  Eating and drinking restrictions Follow instructions from your health care provider about eating and drinking, which may include:  8 hours before the procedure - stop eating heavy meals or foods such as meat, fried foods, or fatty foods.  6 hours before the procedure - stop eating light meals or foods, such as toast or cereal.  6 hours before the  procedure - stop drinking milk or drinks that contain milk.  2 hours before the procedure - stop drinking clear liquids.  General instructions  Ask your health care provider about: ? Changing or stopping your regular medicines. This is especially important if you are taking diabetes medicines or blood thinners. ? Taking medicines such as aspirin and ibuprofen. These medicines can thin your blood. Do not take these medicines before your procedure if your health care provider instructs you not to.  Plan to have someone take you home from the hospital or clinic.  If you will be going home right after the procedure, plan to have someone with you for 24 hours. What happens during the procedure?  To lower your risk of infection, your health care team will wash or sanitize their hands.  An IV tube will be inserted into one of your veins.  You will be given one or more of the following: ? A medicine to help you relax (sedative). ? A medicine to numb the throat area (local anesthetic) and prevent gagging. Your throat may be sprayed with this medicine, or you may gargle the medicine. ? A medicine to make you fall asleep (general anesthetic). ? A medicine to lower your risk of infection (antibiotic), inflammation (anti-inflammatory), or both.  You will lie on your left side.  The endoscope will be inserted through your mouth, down the back of the throat, and into the first part of the small intestine (duodenum).  Then a small, plastic tube (cannula) will be passed through the endoscope and directed into the bile duct or pancreatic duct.  Dye will be injected through the cannula to make structures easier to see on an X-ray.  X-rays will be taken to study the biliary and pancreatic passageways. You may be positioned on your abdomen or your back during the X-rays.  A small sample of tissue (biopsy) may be removed for examination, or other procedures may be done to fix problems that are  found. The procedure may vary among health care providers and hospitals. What happens after the procedure?  Your blood pressure, heart rate, breathing rate, and blood oxygen level will be monitored until the medicines you were given have worn off.  Your throat may feel slightly sore.  You will not be allowed to eat or drink until numbness subsides.  Do not drive for 24 hours if you were given a sedative. Summary  Endoscopic retrograde cholangiopancreatogram is a  procedure that may be used to diagnose or treat problems with the pancreas, bile ducts, liver, and gallbladder.  During ERCP, other procedures may also be done to help diagnose or treat problems that are found. For example, stones can be removed, or a tissue sample can be taken out for testing (biopsy).  Generally, this is a safe procedure. However, problems may occur, including infection, bleeding, pancreatitis, accidental damage to other structures or organs, and allergic reactions to medicines or dyes.  The procedure may vary among health care providers and hospitals. This information is not intended to replace advice given to you by your health care provider. Make sure you discuss any questions you have with your health care provider. Document Released: 07/28/2001 Document Revised: 09/28/2016 Document Reviewed: 09/28/2016 Elsevier Interactive Patient Education  2017 [employer]  Endoscopic Retrograde Cholangiopancreatogram, Care After This sheet gives you information about how to care for yourself after your procedure. Your health care provider may also give you more specific instructions. If you have problems or questions, contact your health care provider. What can I expect after the procedure? After the procedure, it is common to have:  Soreness in your throat.  Nausea.  Bloating.  Dizziness.  Tiredness (fatigue).  Follow these instructions at home:  Take over-the-counter and prescription medicines only as  told by your health care provider.  Do not drive for 24 hours if you were given a medicine to help you relax (sedative) during your procedure. Have someone stay with you for 24 hours after the procedure.  Return to your normal activities as told by your health care provider. Ask your health care provider what activities are safe for you.  Return to eating what you normally do as soon as you feel well enough or as told by your health care provider.  Keep all follow-up visits as told by your health care provider. This is important. Contact a health care provider if:  You have pain in your abdomen that does not get better with medicine.  You develop signs of infection, such as: ? Chills. ? Feeling unwell. Get help right away if:  You have difficulty swallowing.  You have worsening pain in your throat, chest, or abdomen.  You vomit bright red blood or a substance that looks like coffee grounds.  You have bloody or very black stools.  You have a fever.  You have a sudden increase in swelling (bloating) in your abdomen. Summary  After the procedure, it is common to feel tired and to have some discomfort in your throat.  Contact your health care provider if you have signs of infection-such as chills or feeling unwell-or if you have pain that does not improve with medicine.  Get help right away if you have trouble swallowing, worsening pain, bloody or black vomit, bloody or black stools, a fever, or increased swelling in your abdomen.  Keep all follow-up visits as told by your health care provider. This is important. This information is not intended to replace advice given to you by your health care provider. Make sure you discuss any questions you have with your health care provider. Document Released: 08/23/2013 Document Revised: 09/21/2016 Document Reviewed: 09/21/2016 Elsevier Interactive Patient Education  2017 [place] Anesthesia, Adult, Care After These  instructions provide you with information about caring for yourself after your procedure. Your health care provider may also give you more specific instructions. Your treatment has been planned according to current medical practices, but problems sometimes occur. Call your health  care provider if you have any problems or questions after your procedure. What can I expect after the procedure? After the procedure, it is common to have:  Vomiting.  A sore throat.  Mental slowness.  It is common to feel:  Nauseous.  Cold or shivery.  Sleepy.  Tired.  Sore or achy, even in parts of your body where you did not have surgery.  Follow these instructions at home: For at least 24 hours after the procedure:  Do not: ? Participate in activities where you could fall or become injured. ? Drive. ? Use heavy machinery. ? Drink alcohol. ? Take sleeping pills or medicines that cause drowsiness. ? Make important decisions or sign legal documents. ? Take care of children on your own.  Rest. Eating and drinking  If you vomit, drink water, juice, or soup when you can drink without vomiting.  Drink enough fluid to keep your urine clear or pale yellow.  Make sure you have little or no nausea before eating solid foods.  Follow the diet recommended by your health care provider. General instructions  Have a responsible adult stay with you until you are awake and alert.  Return to your normal activities as told by your health care provider. Ask your health care provider what activities are safe for you.  Take over-the-counter and prescription medicines only as told by your health care provider.  If you smoke, do not smoke without supervision.  Keep all follow-up visits as told by your health care provider. This is important. Contact a health care provider if:  You continue to have nausea or vomiting at home, and medicines are not helpful.  You cannot drink fluids or start eating  again.  You cannot urinate after 8-12 hours.  You develop a skin rash.  You have fever.  You have increasing redness at the site of your procedure. Get help right away if:  You have difficulty breathing.  You have chest pain.  You have unexpected bleeding.  You feel that you are having a life-threatening or urgent problem. This information is not intended to replace advice given to you by your health care provider. Make sure you discuss any questions you have with your health care provider. Document Released: 02/08/2001 Document Revised: 04/06/2016 Document Reviewed: 10/17/2015 Elsevier Interactive Patient Education  [employer].

## 2018-07-27 ENCOUNTER — Other Ambulatory Visit: Payer: Self-pay

## 2018-07-27 ENCOUNTER — Encounter (HOSPITAL_COMMUNITY)
Admission: RE | Admit: 2018-07-27 | Discharge: 2018-07-27 | Disposition: A | Discharge status: Home / Self Care | Payer: Medicare HMO | Source: Ambulatory Visit | Attending: Internal Medicine | Admitting: Internal Medicine

## 2018-07-27 ENCOUNTER — Encounter (HOSPITAL_COMMUNITY): Payer: Self-pay

## 2018-07-27 DIAGNOSIS — Z01810 Encounter for preprocedural cardiovascular examination: Secondary | ICD-10-CM | POA: Insufficient documentation

## 2018-07-27 DIAGNOSIS — I10 Essential (primary) hypertension: Secondary | ICD-10-CM | POA: Diagnosis not present

## 2018-07-27 DIAGNOSIS — Z01812 Encounter for preprocedural laboratory examination: Secondary | ICD-10-CM | POA: Insufficient documentation

## 2018-07-27 DIAGNOSIS — K8050 Calculus of bile duct without cholangitis or cholecystitis without obstruction: Secondary | ICD-10-CM | POA: Diagnosis not present

## 2018-07-27 HISTORY — DX: Atherosclerotic heart disease of native coronary artery without angina pectoris: I25.10

## 2018-07-27 LAB — CBC WITH DIFFERENTIAL/PLATELET
BASOS ABS: 0.1 10*3/uL (ref 0.0–0.1)
BASOS PCT: 1 %
EOS ABS: 0.7 10*3/uL (ref 0.0–0.7)
Eosinophils Relative: 9 %
HEMATOCRIT: 39.8 % (ref 39.0–52.0)
Hemoglobin: 13 g/dL (ref 13.0–17.0)
Lymphocytes Relative: 26 %
Lymphs Abs: 2 10*3/uL (ref 0.7–4.0)
MCH: 31.5 pg (ref 26.0–34.0)
MCHC: 32.7 g/dL (ref 30.0–36.0)
MCV: 96.4 fL (ref 78.0–100.0)
MONO ABS: 0.7 10*3/uL (ref 0.1–1.0)
Monocytes Relative: 9 %
NEUTROS ABS: 4.3 10*3/uL (ref 1.7–7.7)
Neutrophils Relative %: 55 %
PLATELETS: 226 10*3/uL (ref 150–400)
RBC: 4.13 MIL/uL — ABNORMAL LOW (ref 4.22–5.81)
RDW: 13.5 % (ref 11.5–15.5)
WBC: 7.7 10*3/uL (ref 4.0–10.5)

## 2018-07-27 LAB — COMPREHENSIVE METABOLIC PANEL
ALBUMIN: 3.6 g/dL (ref 3.5–5.0)
ALT: 15 U/L (ref 0–44)
ANION GAP: 6 (ref 5–15)
AST: 20 U/L (ref 15–41)
Alkaline Phosphatase: 94 U/L (ref 38–126)
BILIRUBIN TOTAL: 0.7 mg/dL (ref 0.3–1.2)
BUN: 15 mg/dL (ref 8–23)
CHLORIDE: 107 mmol/L (ref 98–111)
CO2: 25 mmol/L (ref 22–32)
Calcium: 9.1 mg/dL (ref 8.9–10.3)
Creatinine, Ser: 0.97 mg/dL (ref 0.61–1.24)
GFR calc Af Amer: >60 mL/min (ref >60)
GFR calc non Af Amer: >60 mL/min (ref >60)
GLUCOSE: 96 mg/dL (ref 70–99)
POTASSIUM: 4 mmol/L (ref 3.5–5.1)
SODIUM: 138 mmol/L (ref 135–145)
Total Protein: 6.3 g/dL — ABNORMAL LOW (ref 6.5–8.1)

## 2018-08-01 ENCOUNTER — Encounter (HOSPITAL_COMMUNITY): Payer: Self-pay | Admitting: *Deleted

## 2018-08-01 ENCOUNTER — Ambulatory Visit (HOSPITAL_COMMUNITY): Payer: Medicare HMO | Admitting: Anesthesiology

## 2018-08-01 ENCOUNTER — Encounter (HOSPITAL_COMMUNITY)
Admission: RE | Disposition: A | Discharge status: Home / Self Care | Payer: Self-pay | Source: Ambulatory Visit | Attending: Internal Medicine

## 2018-08-01 ENCOUNTER — Other Ambulatory Visit: Payer: Self-pay

## 2018-08-01 ENCOUNTER — Ambulatory Visit (HOSPITAL_COMMUNITY): Payer: Medicare HMO

## 2018-08-01 ENCOUNTER — Ambulatory Visit (HOSPITAL_COMMUNITY)
Admission: RE | Admit: 2018-08-01 | Discharge: 2018-08-01 | Disposition: A | Discharge status: Home / Self Care | Payer: Medicare HMO | Source: Ambulatory Visit | Attending: Internal Medicine | Admitting: Internal Medicine

## 2018-08-01 DIAGNOSIS — Z87891 Personal history of nicotine dependence: Secondary | ICD-10-CM | POA: Diagnosis not present

## 2018-08-01 DIAGNOSIS — I2510 Atherosclerotic heart disease of native coronary artery without angina pectoris: Secondary | ICD-10-CM | POA: Insufficient documentation

## 2018-08-01 DIAGNOSIS — Z4659 Encounter for fitting and adjustment of other gastrointestinal appliance and device: Secondary | ICD-10-CM | POA: Diagnosis not present

## 2018-08-01 DIAGNOSIS — Z885 Allergy status to narcotic agent status: Secondary | ICD-10-CM | POA: Insufficient documentation

## 2018-08-01 DIAGNOSIS — K8070 Calculus of gallbladder and bile duct without cholecystitis without obstruction: Secondary | ICD-10-CM | POA: Diagnosis not present

## 2018-08-01 DIAGNOSIS — K219 Gastro-esophageal reflux disease without esophagitis: Secondary | ICD-10-CM | POA: Insufficient documentation

## 2018-08-01 DIAGNOSIS — Z79899 Other long term (current) drug therapy: Secondary | ICD-10-CM | POA: Insufficient documentation

## 2018-08-01 DIAGNOSIS — K8050 Calculus of bile duct without cholangitis or cholecystitis without obstruction: Secondary | ICD-10-CM | POA: Diagnosis not present

## 2018-08-01 DIAGNOSIS — K8020 Calculus of gallbladder without cholecystitis without obstruction: Secondary | ICD-10-CM | POA: Diagnosis not present

## 2018-08-01 DIAGNOSIS — I10 Essential (primary) hypertension: Secondary | ICD-10-CM | POA: Insufficient documentation

## 2018-08-01 DIAGNOSIS — Z85038 Personal history of other malignant neoplasm of large intestine: Secondary | ICD-10-CM | POA: Insufficient documentation

## 2018-08-01 DIAGNOSIS — Z7982 Long term (current) use of aspirin: Secondary | ICD-10-CM | POA: Diagnosis not present

## 2018-08-01 DIAGNOSIS — K8030 Calculus of bile duct with cholangitis, unspecified, without obstruction: Secondary | ICD-10-CM | POA: Diagnosis not present

## 2018-08-01 DIAGNOSIS — R932 Abnormal findings on diagnostic imaging of liver and biliary tract: Secondary | ICD-10-CM | POA: Diagnosis not present

## 2018-08-01 DIAGNOSIS — E7800 Pure hypercholesterolemia, unspecified: Secondary | ICD-10-CM | POA: Insufficient documentation

## 2018-08-01 DIAGNOSIS — Z9049 Acquired absence of other specified parts of digestive tract: Secondary | ICD-10-CM | POA: Diagnosis not present

## 2018-08-01 HISTORY — PX: SPYGLASS CHOLANGIOSCOPY: SHX5441

## 2018-08-01 HISTORY — PX: REMOVAL OF STONES: SHX5545

## 2018-08-01 HISTORY — PX: SPHINCTEROTOMY: SHX5544

## 2018-08-01 HISTORY — PX: BALLOON DILATION: SHX5330

## 2018-08-01 HISTORY — PX: ERCP: SHX5425

## 2018-08-01 HISTORY — PX: STENT REMOVAL: SHX6421

## 2018-08-01 SURGERY — ERCP, WITH INTERVENTION IF INDICATED
Anesthesia: General | Site: Esophagus

## 2018-08-01 MED ORDER — SODIUM CHLORIDE 0.9 % IJ SOLN
INTRAMUSCULAR | Status: AC
  Filled 2018-08-01: qty 20

## 2018-08-01 MED ORDER — INDOMETHACIN 50 MG RE SUPP
RECTAL | Status: AC
  Filled 2018-08-01: qty 1

## 2018-08-01 MED ORDER — ROCURONIUM 10MG/ML (10ML) SYRINGE FOR MEDFUSION PUMP - OPTIME
INTRAVENOUS | Status: DC | PRN
  Administered 2018-08-01: 10 mg via INTRAVENOUS
  Administered 2018-08-01: 5 mg via INTRAVENOUS

## 2018-08-01 MED ORDER — INDOMETHACIN 50 MG RE SUPP
100.0000 mg | Freq: Once | RECTAL | Status: AC
  Administered 2018-08-01: 100 mg via RECTAL

## 2018-08-01 MED ORDER — CEFAZOLIN SODIUM-DEXTROSE 2-4 GM/100ML-% IV SOLN
2.0000 g | INTRAVENOUS | Status: AC
  Administered 2018-08-01: 2 g via INTRAVENOUS

## 2018-08-01 MED ORDER — LACTATED RINGERS IV SOLN
INTRAVENOUS | Status: DC
  Administered 2018-08-01 (×2): via INTRAVENOUS

## 2018-08-01 MED ORDER — MEPERIDINE HCL 100 MG/ML IJ SOLN: 6.2500 mg | INTRAMUSCULAR | Status: DC | PRN

## 2018-08-01 MED ORDER — IOPAMIDOL (ISOVUE-300) INJECTION 61%
INTRAVENOUS | Status: DC | PRN
  Administered 2018-08-01: 50 mL via ORAL

## 2018-08-01 MED ORDER — LIDOCAINE HCL (CARDIAC) PF 50 MG/5ML IV SOSY
PREFILLED_SYRINGE | INTRAVENOUS | Status: DC | PRN
  Administered 2018-08-01: 40 mg via INTRAVENOUS

## 2018-08-01 MED ORDER — PROPOFOL 10 MG/ML IV BOLUS
INTRAVENOUS | Status: DC | PRN
  Administered 2018-08-01: 150 mg via INTRAVENOUS

## 2018-08-01 MED ORDER — FENTANYL CITRATE (PF) 100 MCG/2ML IJ SOLN
INTRAMUSCULAR | Status: AC
  Filled 2018-08-01: qty 2

## 2018-08-01 MED ORDER — LIDOCAINE HCL (PF) 1 % IJ SOLN
INTRAMUSCULAR | Status: AC
  Filled 2018-08-01: qty 5

## 2018-08-01 MED ORDER — ROCURONIUM BROMIDE 50 MG/5ML IV SOLN
INTRAVENOUS | Status: AC
  Filled 2018-08-01: qty 1

## 2018-08-01 MED ORDER — GLUCAGON HCL RDNA (DIAGNOSTIC) 1 MG IJ SOLR
INTRAMUSCULAR | Status: DC | PRN
  Administered 2018-08-01: 0.25 mg via INTRAVENOUS

## 2018-08-01 MED ORDER — EPHEDRINE SULFATE 50 MG/ML IJ SOLN
INTRAMUSCULAR | Status: AC
  Filled 2018-08-01: qty 2

## 2018-08-01 MED ORDER — FENTANYL CITRATE (PF) 100 MCG/2ML IJ SOLN
INTRAMUSCULAR | Status: DC | PRN
  Administered 2018-08-01 (×2): 50 ug via INTRAVENOUS

## 2018-08-01 MED ORDER — HYDROMORPHONE HCL 1 MG/ML IJ SOLN: 0.2500 mg | INTRAMUSCULAR | Status: DC | PRN

## 2018-08-01 MED ORDER — SUCCINYLCHOLINE 20MG/ML (10ML) SYRINGE FOR MEDFUSION PUMP - OPTIME
INTRAMUSCULAR | Status: DC | PRN
  Administered 2018-08-01: 140 mg via INTRAVENOUS

## 2018-08-01 MED ORDER — ONDANSETRON HCL 4 MG/2ML IJ SOLN: 4.0000 mg | Freq: Once | INTRAMUSCULAR | Status: DC | PRN

## 2018-08-01 MED ORDER — PROPOFOL 10 MG/ML IV BOLUS
INTRAVENOUS | Status: AC
  Filled 2018-08-01: qty 20

## 2018-08-01 MED ORDER — EPHEDRINE SULFATE 50 MG/ML IJ SOLN
INTRAMUSCULAR | Status: DC | PRN
  Administered 2018-08-01: 10 mg via INTRAVENOUS

## 2018-08-01 MED ORDER — STERILE WATER FOR IRRIGATION IR SOLN
Status: DC | PRN
  Administered 2018-08-01: 1000 mL

## 2018-08-01 MED ORDER — KETOROLAC TROMETHAMINE 30 MG/ML IJ SOLN: 30.0000 mg | Freq: Once | INTRAMUSCULAR | Status: DC | PRN

## 2018-08-01 MED ORDER — CEFAZOLIN SODIUM-DEXTROSE 2-4 GM/100ML-% IV SOLN
INTRAVENOUS | Status: AC
  Filled 2018-08-01: qty 100

## 2018-08-01 MED ORDER — HYDROCODONE-ACETAMINOPHEN 7.5-325 MG PO TABS: 1.0000 | ORAL_TABLET | Freq: Once | ORAL | Status: DC | PRN

## 2018-08-01 NOTE — Discharge Instructions (Signed)
Endoscopic Retrograde Cholangiopancreatogram, Care After This sheet gives you information about how to care for yourself after your procedure. Your health care provider may also give you more specific instructions. If you have problems or questions, contact your health care provider. What can I expect after the procedure? After the procedure, it is common to have:  Soreness in your throat.  Nausea.  Bloating.  Dizziness.  Tiredness (fatigue).  Follow these instructions at home:  Take over-the-counter and prescription medicines only as told by your health care provider.  Do not drive for 24 hours if you were given a medicine to help you relax (sedative) during your procedure. Have someone stay with you for 24 hours after the procedure.  Return to your normal activities as told by your health care provider. Ask your health care provider what activities are safe for you.  Return to eating what you normally do as soon as you feel well enough or as told by your health care provider.  Keep all follow-up visits as told by your health care provider. This is important. Contact a health care provider if:  You have pain in your abdomen that does not get better with medicine.  You develop signs of infection, such as: ? Chills. ? Feeling unwell. Get help right away if:  You have difficulty swallowing.  You have worsening pain in your throat, chest, or abdomen.  You vomit bright red blood or a substance that looks like coffee grounds.  You have bloody or very black stools.  You have a fever.  You have a sudden increase in swelling (bloating) in your abdomen. Summary  After the procedure, it is common to feel tired and to have some discomfort in your throat.  Contact your health care provider if you have signs of infection--such as chills or feeling unwell--or if you have pain that does not improve with medicine.  Get help right away if you have trouble swallowing, worsening  pain, bloody or black vomit, bloody or black stools, a fever, or increased swelling in your abdomen.  Keep all follow-up visits as told by your health care provider. This is important. This information is not intended to replace advice given to you by your health care provider. Make sure you discuss any questions you have with your health care provider. Document Released: 08/23/2013 Document Revised: 09/21/2016 Document Reviewed: 09/21/2016 Elsevier Interactive Patient Education  2017 [place].  No aspirin for 72 hours. No other medications as before. Clear  liquids today and usual diet starting tomorrow morning. No driving for 24 hours. Office visit in 3 months.  Office will call.

## 2018-08-01 NOTE — Op Note (Signed)
[facility] Patient Name: Brandon [name] Procedure Date: 08/01/2018 2:25 PM MRN: [id number] Date of Birth: 03/26/1941 Attending MD: Hildred Laser , MD CSN: [id number] Age: 77 Admit Type: Outpatient Procedure:                ERCP Indications:              Common bile duct stone(s), Biliary stent removal Providers:                Hildred Laser, MD Referring MD:              Medicines:                General Anesthesia Complications:            No immediate complications. Estimated Blood Loss:     Estimated blood loss was minimal. Procedure:                Pre-Anesthesia Assessment:                           - Prior to the procedure, a History and Physical                            was performed, and patient medications and                            allergies were reviewed. The patient's tolerance of                            previous anesthesia was also reviewed. The risks                            and benefits of the procedure and the sedation                            options and risks were discussed with the patient.                            All questions were answered, and informed consent                            was obtained. Prior Anticoagulants: The patient                            last took aspirin 3 days prior to the procedure.                            ASA Grade Assessment: III - A patient with severe                            systemic disease. After reviewing the risks and                            benefits, the patient was deemed in satisfactory  condition to undergo the procedure.                           After obtaining informed consent, the scope was                            passed under direct vision. Throughout the                            procedure, the patient's blood pressure, pulse, and                            oxygen saturations were monitored continuously. The                            [id number]-[id number] ([id number])  scope was introduced through                            the and used to inject contrast into and used to                            inject contrast into the bile duct. The ERCP was                            technically difficult and complex. The patient                            tolerated the procedure well. Findings:      A biliary stent was visible on the scout film. The esophagus was       successfully intubated under direct vision. The scope was advanced to a       normal major papilla in the descending duodenum without detailed       examination of the pharynx, larynx and associated structures, and upper       GI tract. The upper GI tract was grossly normal. One stent was removed       from the biliary tree using a snare. The stent was found to be partially       occluded via the water column test. The bile duct was deeply cannulated       with the traction (standard) sphincterotome and Hydratome       sphincterotome. Contrast was injected. I personally interpreted the bile       duct images. There was brisk flow of contrast through the ducts. Image       quality was excellent. Contrast extended to the entire biliary tree. The       common bile duct contained filling defect(s) thought to be a stone. A 6       mm biliary sphincterotomy was made with a braided Autotome       sphincterotome using ERBE electrocautery. There was no       post-sphincterotomy bleeding. Dilation of major papilla with an 06-24-09       mm balloon (to a maximum balloon size of 8 mm), an 06-24-09 mm balloon (to       a maximum balloon size of 9 mm) and an 06-24-09 mm balloon (to a maximum  balloon size of 10 mm) dilator was successful. To discover objects, the       biliary tree was swept with a basket starting at the bifurcation. Sludge       was swept from the duct. The biliary tree was swept with a 9 mm balloon       and 12 mm balloon starting at the bifurcation. Sludge was swept from the       duct. One  stone was removed. No stones remained. spyglass examination       performed to confirm that no stone left behind. Impression:               - A filling defect consistent with a stone in                            distal CBD was seen on the cholangiogram.                           - Choledocholithiasis was found. Complete removal                            was accomplished by biliary sphincterotomy and                            balloon extraction.                           - One stent was removed from the biliary tree.                           - A biliary sphincterotomy was performed.                           - Major papilla was successfully dilated.                           - The biliary tree was swept and sludge was found                            with dormia basket.                           - The biliary tree was swept with stone balloon                            extractor and stone removed.                           - Spyglass examination performed. No stones left                            behind.                           Schatski's ring disrupted with duodenoscope. Moderate Sedation:      Per Anesthesia Care Recommendation:           - Indomethacin supp  100 mg pr now.                           - Avoid aspirin and nonsteroidal anti-inflammatory                            medicines for 3 days.                           - Clear liquid diet today. Usual diet starting                            tomorrow.                           - Continue present medications.                           - Return to GI clinic in 3 months. Procedure Code(s):        --- Professional ---                           [phone], Endoscopic retrograde                            cholangiopancreatography (ERCP); with removal of                            foreign body(s) or stent(s) from biliary/pancreatic                            duct(s)                           43264, Endoscopic retrograde                             cholangiopancreatography (ERCP); with removal of                            calculi/debris from biliary/pancreatic duct(s) Diagnosis Code(s):        --- Professional ---                           K80.50, Calculus of bile duct without cholangitis                            or cholecystitis without obstruction                           Z46.59, Encounter for fitting and adjustment of                            other gastrointestinal appliance and device                           R93.2, Abnormal findings on diagnostic imaging of  liver and biliary tract CPT copyright 2017 [employer]. All rights reserved. The codes documented in this report are preliminary and upon coder review may  be revised to meet current compliance requirements. Hildred Laser, MD Hildred Laser, MD 08/01/2018 2:40:41 PM This report has been signed electronically. Number of Addenda: 0

## 2018-08-01 NOTE — H&P (Signed)
Brandon [name] is an 77 y.o. male.   Chief Complaint: Patient is here for ERCP with stent and stone removal. HPI: Patient is 77 year old Caucasian male who was hospitalized in July 2019 with cholangitis and underwent ERCP.  He had single large impacted stone in distal CBD which could not be dislodged.  Therefore biliary stent was placed.  He went on to have open cholecystectomy.  He has done well since then.  His appetite is now great he has gained 20 pounds he tells me.  He has had some subcostal pain with certain movements but he has not had postprandial pain nausea vomiting fever or chills. Had preop lab.  His anemia has resolved.  His hemoglobin is up to 13 g and LFTs are now normal.  Past Medical History:  Diagnosis Date  . Chest pain, unspecified   . Colon cancer ([place]) 1999  . Coronary artery disease   . GERD (gastroesophageal reflux disease)   . History of gout   . History of herniorrhaphy   . Hypercholesteremia   . Hypertension     Past Surgical History:  Procedure Laterality Date  . AMPUTATION OF REPLICATED TOES Right    big toe  . APPENDECTOMY    . BILIARY STENT PLACEMENT N/A 06/15/2018   Procedure: BILIARY STENT PLACEMENT;  Surgeon: Rogene Houston, MD;  Location: [facility];  Service: Endoscopy;  Laterality: N/A;  . CARDIAC CATHETERIZATION     stents x2  . CHOLECYSTECTOMY N/A 06/17/2018   Procedure: ATTEMPTED LAPAROSCOPIC CHOLECYSTECTOMY,;  Surgeon: Aviva Signs, MD;  Location: [facility];  Service: General;  Laterality: N/A;  . CHOLECYSTECTOMY N/A 06/17/2018   Procedure: CHOLECYSTECTOMY;  Surgeon: Aviva Signs, MD;  Location: [facility];  Service: General;  Laterality: N/A;  . COLONOSCOPY N/A 11/21/2014   Procedure: COLONOSCOPY;  Surgeon: Rogene Houston, MD;  Location: [facility];  Service: Endoscopy;  Laterality: N/A;  830  . CORONARY ANGIOPLASTY    . ERCP N/A 06/15/2018   Procedure: ENDOSCOPIC RETROGRADE CHOLANGIOPANCREATOGRAPHY (ERCP);  Surgeon: Rogene Houston, MD;   Location: [facility];  Service: Endoscopy;  Laterality: N/A;  . heart stents    . HERNIA REPAIR     Incisional hernia  . INGUINAL HERNIA REPAIR Right 09/02/2016   Procedure: HERNIA REPAIR INGUINAL ADULT WITH MESH;  Surgeon: Aviva Signs, MD;  Location: [facility];  Service: General;  Laterality: Right;  . Left foot surgery Left    lawn mower accidnet  . PARTIAL COLECTOMY    . Right great toe surgery    . SPHINCTEROTOMY N/A 06/15/2018   Procedure: SPHINCTEROTOMY;  Surgeon: Rogene Houston, MD;  Location: [facility];  Service: Endoscopy;  Laterality: N/A;  . TONSILLECTOMY      Family History  Problem Relation Age of Onset  . Cerebral aneurysm Mother   . Alzheimer's disease Father    Social History:  reports that he quit smoking about 8 years ago. His smoking use included cigarettes. He has a 30.00 pack-year smoking history. His smokeless tobacco use includes snuff. He reports that he drinks alcohol. He reports that he does not use drugs.  Allergies:  Allergies  Allergen Reactions  . Oxycodone   . Oxycontin [Oxycodone Hcl]     Hallucinations     Medications Prior to Admission  Medication Sig Dispense Refill  . aspirin 325 MG tablet Take 325 mg by mouth daily.      Marland Kitchen b complex vitamins capsule Take 1 capsule by mouth 2 (  two) times daily.    . clobetasol (OLUX) 0.05 % topical foam Apply 1 application topically 2 (two) times daily as needed (rash).     . famotidine (PEPCID) 10 MG tablet Take 10 mg by mouth 2 (two) times daily as needed for heartburn or indigestion.     Marland Kitchen loperamide (IMODIUM A-D) 2 MG tablet Take 1 tablet (2 mg total) by mouth daily as needed for diarrhea or loose stools. 10 tablet 0  . nitroGLYCERIN (NITROSTAT) 0.4 MG SL tablet Place 0.4 mg under the tongue every 5 (five) minutes as needed for chest pain.     . simvastatin (ZOCOR) 20 MG tablet Take 20 mg by mouth at bedtime.     . tamsulosin (FLOMAX) 0.4 MG CAPS capsule Take 1 capsule (0.4 mg total) by mouth  daily. 30 capsule 1  . traMADol (ULTRAM) 50 MG tablet Take 1 tablet (50 mg total) by mouth every 6 (six) hours as needed. (Patient taking differently: Take 50 mg by mouth every 6 (six) hours as needed for moderate pain. ) 25 tablet 0    No results found for this or any previous visit (from the past 48 hour(s)). No results found.  ROS  There were no vitals taken for this visit. Physical Exam  Constitutional: He appears well-developed and well-nourished.  HENT:  Mouth/Throat: Oropharynx is clear and moist.  Eyes: Conjunctivae are normal. No scleral icterus.  Neck: No thyromegaly present.  Cardiovascular: Normal rate, regular rhythm and normal heart sounds.  No murmur heard. Respiratory: Effort normal and breath sounds normal.  GI:  He has a right subcostal and upper midline scars.  Abdomen is soft and nontender with organomegaly or masses.  Musculoskeletal: He exhibits no edema.  Lymphadenopathy:    He has no cervical adenopathy.  Neurological: He is alert.  Skin: Skin is warm and dry.     Assessment/Plan Choledocholithiasis treated with biliary stenting. ERCP with stent and stone removal. If stone cannot be removed spyglass examination will be considered.  Hildred Laser, MD 08/01/2018, 12:32 PM

## 2018-08-01 NOTE — Anesthesia Preprocedure Evaluation (Signed)
Anesthesia Evaluation  Patient identified by MRN, date of birth, ID band Patient awake    Reviewed: Allergy & Precautions, H&P , NPO status , Patient's Chart, lab work & pertinent test results, reviewed documented beta blocker date and time   Airway Mallampati: II  TM Distance: >3 FB Neck ROM: full    Dental no notable dental hx. (+) Teeth Intact, Dental Advidsory Given   Pulmonary neg pulmonary ROS, former smoker,    Pulmonary exam normal breath sounds clear to auscultation       Cardiovascular Exercise Tolerance: Good hypertension, On Medications + CAD  negative cardio ROS   Rhythm:regular Rate:Normal     Neuro/Psych negative neurological ROS  negative psych ROS   GI/Hepatic negative GI ROS, Neg liver ROS, GERD  ,  Endo/Other  negative endocrine ROS  Renal/GU negative Renal ROS  negative genitourinary   Musculoskeletal   Abdominal   Peds  Hematology negative hematology ROS (+)   Anesthesia Other Findings ERCP on 7/31 with retained stone, stent for lap chole  Denies any anesthesia issues on 7/31  Stent removal today 08/01/18  Reproductive/Obstetrics negative OB ROS                             Anesthesia Physical  Anesthesia Plan  ASA: III  Anesthesia Plan: General   Post-op Pain Management:    Induction:   PONV Risk Score and Plan:   Airway Management Planned:   Additional Equipment:   Intra-op Plan:   Post-operative Plan:   Informed Consent: I have reviewed the patients History and Physical, chart, labs and discussed the procedure including the risks, benefits and alternatives for the proposed anesthesia with the patient or authorized representative who has indicated his/her understanding and acceptance.   Dental Advisory Given  Plan Discussed with: CRNA  Anesthesia Plan Comments:         Anesthesia Quick Evaluation

## 2018-08-01 NOTE — Transfer of Care (Signed)
Immediate Anesthesia Transfer of Care Note  Patient: Brandon [name]  Procedure(s) Performed: ENDOSCOPIC RETROGRADE CHOLANGIOPANCREATOGRAPHY (ERCP) (N/A Esophagus) REMOVAL OF STONES (N/A Esophagus) SPYGLASS CHOLANGIOSCOPY (N/A Esophagus) BILIARY STENT REMOVAL (N/A Esophagus) BALLOON DILATION (N/A Esophagus) SPHINCTEROTOMY (N/A Esophagus)  Patient Location: PACU  Anesthesia Type:General  Level of Consciousness: awake, alert  and oriented  Airway & Oxygen Therapy: Patient Spontanous Breathing  Post-op Assessment: Report given to RN  Post vital signs: Reviewed and stable  Last Vitals:  Vitals Value Taken Time  BP 125/60 08/01/2018  2:30 PM  Temp    Pulse 68 08/01/2018  2:33 PM  Resp 19 08/01/2018  2:33 PM  SpO2 97 % 08/01/2018  2:33 PM  Vitals shown include unvalidated device data.  Last Pain:  Vitals:   08/01/18 1236  TempSrc: Oral  PainSc: 0-No pain         Complications: No apparent anesthesia complications

## 2018-08-01 NOTE — Anesthesia Procedure Notes (Signed)
Procedure Name: Intubation Date/Time: 08/01/2018 1:53 PM Performed by: Ollen Bowl, CRNA Pre-anesthesia Checklist: Patient identified, Patient being monitored, Timeout performed, Emergency Drugs available and Suction available Patient Re-evaluated:Patient Re-evaluated prior to induction Oxygen Delivery Method: Circle system utilized Preoxygenation: Pre-oxygenation with 100% oxygen Induction Type: IV induction Ventilation: Mask ventilation without difficulty Laryngoscope Size: Mac and 3 Grade View: Grade I Tube type: Oral Tube size: 7.0 mm Number of attempts: 1 Airway Equipment and Method: Stylet Placement Confirmation: ETT inserted through vocal cords under direct vision,  positive ETCO2 and breath sounds checked- equal and bilateral Secured at: 21 cm Tube secured with: Tape Dental Injury: Teeth and Oropharynx as per pre-operative assessment

## 2018-08-02 NOTE — Anesthesia Postprocedure Evaluation (Signed)
Anesthesia Post Note  Patient: Brandon [name]  Procedure(s) Performed: ENDOSCOPIC RETROGRADE CHOLANGIOPANCREATOGRAPHY (ERCP) (N/A Esophagus) REMOVAL OF STONES (N/A Esophagus) SPYGLASS CHOLANGIOSCOPY (N/A Esophagus) BILIARY STENT REMOVAL (N/A Esophagus) BALLOON DILATION (N/A Esophagus) SPHINCTEROTOMY (N/A Esophagus)  Patient location during evaluation: PACU Anesthesia Type: General Level of consciousness: awake and alert and patient cooperative Pain management: satisfactory to patient Vital Signs Assessment: post-procedure vital signs reviewed and stable Respiratory status: spontaneous breathing Cardiovascular status: stable Postop Assessment: no apparent nausea or vomiting Anesthetic complications: no     Last Vitals:  Vitals:   08/01/18 1500 08/01/18 1541  BP: 123/63 131/64  Pulse: (!) 57 (!) 59  Resp: 17   Temp:    SpO2:  99%    Last Pain:  Vitals:   08/01/18 1541  TempSrc:   PainSc: 0-No pain                 Nirali Magouirk

## 2018-08-05 ENCOUNTER — Encounter (HOSPITAL_COMMUNITY): Payer: Self-pay | Admitting: Internal Medicine

## 2018-08-17 DIAGNOSIS — I2510 Atherosclerotic heart disease of native coronary artery without angina pectoris: Secondary | ICD-10-CM | POA: Diagnosis not present

## 2018-08-17 DIAGNOSIS — I10 Essential (primary) hypertension: Secondary | ICD-10-CM | POA: Diagnosis not present

## 2018-08-25 DIAGNOSIS — I10 Essential (primary) hypertension: Secondary | ICD-10-CM | POA: Diagnosis not present

## 2018-08-25 DIAGNOSIS — Z299 Encounter for prophylactic measures, unspecified: Secondary | ICD-10-CM | POA: Diagnosis not present

## 2018-08-25 DIAGNOSIS — Z6829 Body mass index (BMI) 29.0-29.9, adult: Secondary | ICD-10-CM | POA: Diagnosis not present

## 2018-08-25 DIAGNOSIS — C189 Malignant neoplasm of colon, unspecified: Secondary | ICD-10-CM | POA: Diagnosis not present

## 2018-08-25 DIAGNOSIS — E7800 Pure hypercholesterolemia, unspecified: Secondary | ICD-10-CM | POA: Diagnosis not present

## 2018-08-25 DIAGNOSIS — Z2821 Immunization not carried out because of patient refusal: Secondary | ICD-10-CM | POA: Diagnosis not present

## 2018-08-26 DIAGNOSIS — E7800 Pure hypercholesterolemia, unspecified: Secondary | ICD-10-CM | POA: Diagnosis not present

## 2018-08-31 DIAGNOSIS — Z0100 Encounter for examination of eyes and vision without abnormal findings: Secondary | ICD-10-CM | POA: Diagnosis not present

## 2018-08-31 DIAGNOSIS — I10 Essential (primary) hypertension: Secondary | ICD-10-CM | POA: Diagnosis not present

## 2018-09-29 DIAGNOSIS — I2510 Atherosclerotic heart disease of native coronary artery without angina pectoris: Secondary | ICD-10-CM | POA: Diagnosis not present

## 2018-09-29 DIAGNOSIS — I10 Essential (primary) hypertension: Secondary | ICD-10-CM | POA: Diagnosis not present

## 2018-10-28 DIAGNOSIS — I10 Essential (primary) hypertension: Secondary | ICD-10-CM | POA: Diagnosis not present

## 2018-10-28 DIAGNOSIS — I2510 Atherosclerotic heart disease of native coronary artery without angina pectoris: Secondary | ICD-10-CM | POA: Diagnosis not present

## 2018-11-15 ENCOUNTER — Encounter (INDEPENDENT_AMBULATORY_CARE_PROVIDER_SITE_OTHER): Payer: Self-pay | Admitting: *Deleted

## 2018-11-23 DIAGNOSIS — I10 Essential (primary) hypertension: Secondary | ICD-10-CM | POA: Diagnosis not present

## 2018-11-23 DIAGNOSIS — I2510 Atherosclerotic heart disease of native coronary artery without angina pectoris: Secondary | ICD-10-CM | POA: Diagnosis not present

## 2018-11-25 DIAGNOSIS — I10 Essential (primary) hypertension: Secondary | ICD-10-CM | POA: Diagnosis not present

## 2018-11-25 DIAGNOSIS — Z299 Encounter for prophylactic measures, unspecified: Secondary | ICD-10-CM | POA: Diagnosis not present

## 2018-11-25 DIAGNOSIS — E7800 Pure hypercholesterolemia, unspecified: Secondary | ICD-10-CM | POA: Diagnosis not present

## 2018-11-25 DIAGNOSIS — Z6831 Body mass index (BMI) 31.0-31.9, adult: Secondary | ICD-10-CM | POA: Diagnosis not present

## 2018-11-25 DIAGNOSIS — Z87891 Personal history of nicotine dependence: Secondary | ICD-10-CM | POA: Diagnosis not present

## 2018-12-06 ENCOUNTER — Encounter (INDEPENDENT_AMBULATORY_CARE_PROVIDER_SITE_OTHER): Payer: Self-pay | Admitting: Internal Medicine

## 2018-12-06 ENCOUNTER — Ambulatory Visit (INDEPENDENT_AMBULATORY_CARE_PROVIDER_SITE_OTHER): Payer: Medicare HMO | Admitting: Internal Medicine

## 2018-12-06 VITALS — BP 158/81 | HR 60 | Temp 97.7°F | Resp 18 | Ht 67.0 in | Wt 216.9 lb

## 2018-12-06 DIAGNOSIS — R1031 Right lower quadrant pain: Secondary | ICD-10-CM

## 2018-12-06 DIAGNOSIS — Z85038 Personal history of other malignant neoplasm of large intestine: Secondary | ICD-10-CM | POA: Diagnosis not present

## 2018-12-06 NOTE — Patient Instructions (Signed)
Call if right lower quadrant abdominal pain gets worse or if you have rectal bleeding. Surveillance colonoscopy to be scheduled in January 2021.

## 2018-12-06 NOTE — Progress Notes (Signed)
Presenting complaint;  History of choledocholithiasis. Pain at right lower quadrant.  Database and subjective:  Patient is 78 year old Caucasian male who is here for scheduled visit.  He was last seen on 07/19/2018 and was doing well.  On that visit he weighed 187 pounds. He was hospitalized in July last year for obstructive jaundice secondary to choledocholithiasis.  He underwent ERCP with stenting followed by cholecystectomy and he finally had stone removed on second ERCP of 08/01/2018. Patient states he feels much better.  Anorexia has resolved.  He has very good appetite.  Last year he has lost close to 70 pounds.  He has gained 29 pounds since his last visit.  He states since his surgeries he has had very little in the way of heartburn.  He may take famotidine couple of times a week.  Previously he was taking on daily basis.  He has received letter from my office to schedule surveillance colonoscopy but he would like to wait until next year.  He states he is still paying bills from prior interventions.  He tells me that he is surprised that he has been reported to collection agency although he has been paying $100 every month. He had diarrhea during Christmas holidays because he was eating different foods.  He is back to his baseline which is 1-2 bowel movements per day.  He denies melena or rectal bleeding.  He takes Imodium once or twice a month.  He continues to complain of pain in the right lower quadrant which she describes to be mild pain.  Is intermittent.  Pain is worse when he does physical work.  This pain is not associated with dysuria or hematuria.  Pain is fairly localized.  Current Medications: Outpatient Encounter Medications as of 12/06/2018  Medication Sig  . aspirin 325 MG tablet Take 1 tablet (325 mg total) by mouth daily.  . clobetasol (OLUX) 0.05 % topical foam Apply 1 application topically 2 (two) times daily as needed (rash).   . famotidine (PEPCID) 10 MG tablet Take 10 mg by  mouth 2 (two) times daily as needed for heartburn or indigestion.   Marland Kitchen loperamide (IMODIUM A-D) 2 MG tablet Take 1 tablet (2 mg total) by mouth daily as needed for diarrhea or loose stools.  . nitroGLYCERIN (NITROSTAT) 0.4 MG SL tablet Place 0.4 mg under the tongue every 5 (five) minutes as needed for chest pain.   . Omega-3 Krill Oil 450 MG CAPS Take by mouth daily. This is Omega 4  [employer].  . simvastatin (ZOCOR) 20 MG tablet Take 20 mg by mouth at bedtime.   . tamsulosin (FLOMAX) 0.4 MG CAPS capsule Take 1 capsule (0.4 mg total) by mouth daily.  Marland Kitchen b complex vitamins capsule Take 1 capsule by mouth 2 (two) times daily.  . [DISCONTINUED] traMADol (ULTRAM) 50 MG tablet Take 1 tablet (50 mg total) by mouth every 6 (six) hours as needed. (Patient not taking: Reported on 12/06/2018)   No facility-administered encounter medications on file as of 12/06/2018.      Objective: Blood pressure (!) 158/81, pulse 60, temperature 97.7 F (36.5 C), temperature source Oral, resp. rate 18, height 5\' 7"  (1.702 m), weight 216 lb 14.4 oz (98.4 kg). Patient is alert and in no acute distress. Conjunctiva is pink. Sclera is nonicteric Oropharyngeal mucosa is normal. No neck masses or thyromegaly noted. Cardiac exam with regular rhythm normal S1 and S2. No murmur or gallop noted. Lungs are clear to auscultation. Abdomen is full.  He  has lower midline scar as well as small right subcostal scar.  Abdomen is soft and nontender with organomegaly or masses. No LE edema or clubbing noted.   Assessment:  #1.  History of choledocholithiasis.  His duct was cleared of all the stones on ERCP of September 2019 when biliary stent was also removed.  His transaminases and were normal.  No further work-up other than checking his LFTs which she will have in April by his PCP.  #2.  Right lower quadrant abdominal pain most likely is referred pain from his back.  If this pain worsens he will have further evaluation by his PCP.     #3.  History of colon carcinoma.  Last colonoscopy was in January 2016 with removal of 2 small tubular adenomas.  I believe it is all right for him to wait 1 more year before next exam.  However if he experiences rectal bleeding will change plans.   Plan:  Patient will call if right lower quadrant abdominal pain worsens. Surveillance colonoscopy in January 2021. Office visit on as-needed basis.

## 2018-12-27 DIAGNOSIS — I2510 Atherosclerotic heart disease of native coronary artery without angina pectoris: Secondary | ICD-10-CM | POA: Diagnosis not present

## 2018-12-27 DIAGNOSIS — I10 Essential (primary) hypertension: Secondary | ICD-10-CM | POA: Diagnosis not present

## 2019-01-25 DIAGNOSIS — Z299 Encounter for prophylactic measures, unspecified: Secondary | ICD-10-CM | POA: Diagnosis not present

## 2019-01-25 DIAGNOSIS — Z6833 Body mass index (BMI) 33.0-33.9, adult: Secondary | ICD-10-CM | POA: Diagnosis not present

## 2019-01-25 DIAGNOSIS — E7800 Pure hypercholesterolemia, unspecified: Secondary | ICD-10-CM | POA: Diagnosis not present

## 2019-01-25 DIAGNOSIS — M109 Gout, unspecified: Secondary | ICD-10-CM | POA: Diagnosis not present

## 2019-01-25 DIAGNOSIS — I10 Essential (primary) hypertension: Secondary | ICD-10-CM | POA: Diagnosis not present

## 2019-02-20 DIAGNOSIS — I2510 Atherosclerotic heart disease of native coronary artery without angina pectoris: Secondary | ICD-10-CM | POA: Diagnosis not present

## 2019-02-20 DIAGNOSIS — I10 Essential (primary) hypertension: Secondary | ICD-10-CM | POA: Diagnosis not present

## 2019-02-27 DIAGNOSIS — Z1331 Encounter for screening for depression: Secondary | ICD-10-CM | POA: Diagnosis not present

## 2019-02-27 DIAGNOSIS — Z7189 Other specified counseling: Secondary | ICD-10-CM | POA: Diagnosis not present

## 2019-02-27 DIAGNOSIS — R5383 Other fatigue: Secondary | ICD-10-CM | POA: Diagnosis not present

## 2019-02-27 DIAGNOSIS — I10 Essential (primary) hypertension: Secondary | ICD-10-CM | POA: Diagnosis not present

## 2019-02-27 DIAGNOSIS — E7800 Pure hypercholesterolemia, unspecified: Secondary | ICD-10-CM | POA: Diagnosis not present

## 2019-02-27 DIAGNOSIS — Z1339 Encounter for screening examination for other mental health and behavioral disorders: Secondary | ICD-10-CM | POA: Diagnosis not present

## 2019-02-27 DIAGNOSIS — Z0000 Encounter for general adult medical examination without abnormal findings: Secondary | ICD-10-CM | POA: Diagnosis not present

## 2019-02-27 DIAGNOSIS — Z6833 Body mass index (BMI) 33.0-33.9, adult: Secondary | ICD-10-CM | POA: Diagnosis not present

## 2019-02-27 DIAGNOSIS — Z299 Encounter for prophylactic measures, unspecified: Secondary | ICD-10-CM | POA: Diagnosis not present

## 2019-04-17 DIAGNOSIS — I2510 Atherosclerotic heart disease of native coronary artery without angina pectoris: Secondary | ICD-10-CM | POA: Diagnosis not present

## 2019-04-17 DIAGNOSIS — I10 Essential (primary) hypertension: Secondary | ICD-10-CM | POA: Diagnosis not present

## 2019-05-18 DIAGNOSIS — I2510 Atherosclerotic heart disease of native coronary artery without angina pectoris: Secondary | ICD-10-CM | POA: Diagnosis not present

## 2019-05-18 DIAGNOSIS — I10 Essential (primary) hypertension: Secondary | ICD-10-CM | POA: Diagnosis not present

## 2019-08-18 DIAGNOSIS — R3989 Other symptoms and signs involving the genitourinary system: Secondary | ICD-10-CM | POA: Diagnosis not present

## 2019-08-18 DIAGNOSIS — E7800 Pure hypercholesterolemia, unspecified: Secondary | ICD-10-CM | POA: Diagnosis not present

## 2019-08-18 DIAGNOSIS — C189 Malignant neoplasm of colon, unspecified: Secondary | ICD-10-CM | POA: Diagnosis not present

## 2019-08-18 DIAGNOSIS — Z299 Encounter for prophylactic measures, unspecified: Secondary | ICD-10-CM | POA: Diagnosis not present

## 2019-08-18 DIAGNOSIS — Z6833 Body mass index (BMI) 33.0-33.9, adult: Secondary | ICD-10-CM | POA: Diagnosis not present

## 2019-08-18 DIAGNOSIS — Z2821 Immunization not carried out because of patient refusal: Secondary | ICD-10-CM | POA: Diagnosis not present

## 2019-08-18 DIAGNOSIS — I10 Essential (primary) hypertension: Secondary | ICD-10-CM | POA: Diagnosis not present

## 2019-09-13 DIAGNOSIS — I10 Essential (primary) hypertension: Secondary | ICD-10-CM | POA: Diagnosis not present

## 2019-09-13 DIAGNOSIS — Z6833 Body mass index (BMI) 33.0-33.9, adult: Secondary | ICD-10-CM | POA: Diagnosis not present

## 2019-09-13 DIAGNOSIS — Z2821 Immunization not carried out because of patient refusal: Secondary | ICD-10-CM | POA: Diagnosis not present

## 2019-09-13 DIAGNOSIS — Z85038 Personal history of other malignant neoplasm of large intestine: Secondary | ICD-10-CM | POA: Diagnosis not present

## 2019-09-13 DIAGNOSIS — I2510 Atherosclerotic heart disease of native coronary artery without angina pectoris: Secondary | ICD-10-CM | POA: Diagnosis not present

## 2019-09-13 DIAGNOSIS — Z89411 Acquired absence of right great toe: Secondary | ICD-10-CM | POA: Diagnosis not present

## 2019-09-13 DIAGNOSIS — Z299 Encounter for prophylactic measures, unspecified: Secondary | ICD-10-CM | POA: Diagnosis not present

## 2019-09-21 DIAGNOSIS — I10 Essential (primary) hypertension: Secondary | ICD-10-CM | POA: Diagnosis not present

## 2019-09-21 DIAGNOSIS — I2510 Atherosclerotic heart disease of native coronary artery without angina pectoris: Secondary | ICD-10-CM | POA: Diagnosis not present

## 2019-10-17 DIAGNOSIS — I2510 Atherosclerotic heart disease of native coronary artery without angina pectoris: Secondary | ICD-10-CM | POA: Diagnosis not present

## 2019-10-17 DIAGNOSIS — I10 Essential (primary) hypertension: Secondary | ICD-10-CM | POA: Diagnosis not present

## 2019-10-20 DIAGNOSIS — Z79899 Other long term (current) drug therapy: Secondary | ICD-10-CM | POA: Diagnosis not present

## 2019-10-20 DIAGNOSIS — Z7189 Other specified counseling: Secondary | ICD-10-CM | POA: Diagnosis not present

## 2019-10-20 DIAGNOSIS — Z1211 Encounter for screening for malignant neoplasm of colon: Secondary | ICD-10-CM | POA: Diagnosis not present

## 2019-10-20 DIAGNOSIS — Z6833 Body mass index (BMI) 33.0-33.9, adult: Secondary | ICD-10-CM | POA: Diagnosis not present

## 2019-10-20 DIAGNOSIS — Z1339 Encounter for screening examination for other mental health and behavioral disorders: Secondary | ICD-10-CM | POA: Diagnosis not present

## 2019-10-20 DIAGNOSIS — Z125 Encounter for screening for malignant neoplasm of prostate: Secondary | ICD-10-CM | POA: Diagnosis not present

## 2019-10-20 DIAGNOSIS — Z1331 Encounter for screening for depression: Secondary | ICD-10-CM | POA: Diagnosis not present

## 2019-10-20 DIAGNOSIS — Z299 Encounter for prophylactic measures, unspecified: Secondary | ICD-10-CM | POA: Diagnosis not present

## 2019-10-20 DIAGNOSIS — E7800 Pure hypercholesterolemia, unspecified: Secondary | ICD-10-CM | POA: Diagnosis not present

## 2019-10-20 DIAGNOSIS — Z0000 Encounter for general adult medical examination without abnormal findings: Secondary | ICD-10-CM | POA: Diagnosis not present

## 2019-10-20 DIAGNOSIS — R5383 Other fatigue: Secondary | ICD-10-CM | POA: Diagnosis not present

## 2019-10-20 DIAGNOSIS — I10 Essential (primary) hypertension: Secondary | ICD-10-CM | POA: Diagnosis not present

## 2019-11-13 ENCOUNTER — Encounter (INDEPENDENT_AMBULATORY_CARE_PROVIDER_SITE_OTHER): Payer: Self-pay | Admitting: *Deleted

## 2019-11-20 DIAGNOSIS — I10 Essential (primary) hypertension: Secondary | ICD-10-CM | POA: Diagnosis not present

## 2019-11-20 DIAGNOSIS — I2510 Atherosclerotic heart disease of native coronary artery without angina pectoris: Secondary | ICD-10-CM | POA: Diagnosis not present

## 2020-01-23 DIAGNOSIS — Z89411 Acquired absence of right great toe: Secondary | ICD-10-CM | POA: Diagnosis not present

## 2020-01-23 DIAGNOSIS — I2510 Atherosclerotic heart disease of native coronary artery without angina pectoris: Secondary | ICD-10-CM | POA: Diagnosis not present

## 2020-01-23 DIAGNOSIS — Z6834 Body mass index (BMI) 34.0-34.9, adult: Secondary | ICD-10-CM | POA: Diagnosis not present

## 2020-01-23 DIAGNOSIS — Z87891 Personal history of nicotine dependence: Secondary | ICD-10-CM | POA: Diagnosis not present

## 2020-01-23 DIAGNOSIS — Z299 Encounter for prophylactic measures, unspecified: Secondary | ICD-10-CM | POA: Diagnosis not present

## 2020-01-23 DIAGNOSIS — I10 Essential (primary) hypertension: Secondary | ICD-10-CM | POA: Diagnosis not present

## 2020-01-23 DIAGNOSIS — E7800 Pure hypercholesterolemia, unspecified: Secondary | ICD-10-CM | POA: Diagnosis not present

## 2020-02-07 DIAGNOSIS — I10 Essential (primary) hypertension: Secondary | ICD-10-CM | POA: Diagnosis not present

## 2020-02-07 DIAGNOSIS — I2510 Atherosclerotic heart disease of native coronary artery without angina pectoris: Secondary | ICD-10-CM | POA: Diagnosis not present

## 2020-04-14 DIAGNOSIS — I10 Essential (primary) hypertension: Secondary | ICD-10-CM | POA: Diagnosis not present

## 2020-04-14 DIAGNOSIS — I2510 Atherosclerotic heart disease of native coronary artery without angina pectoris: Secondary | ICD-10-CM | POA: Diagnosis not present

## 2020-04-23 DIAGNOSIS — I2510 Atherosclerotic heart disease of native coronary artery without angina pectoris: Secondary | ICD-10-CM | POA: Diagnosis not present

## 2020-04-23 DIAGNOSIS — I700 Atherosclerosis of aorta: Secondary | ICD-10-CM | POA: Diagnosis not present

## 2020-04-23 DIAGNOSIS — E7800 Pure hypercholesterolemia, unspecified: Secondary | ICD-10-CM | POA: Diagnosis not present

## 2020-04-23 DIAGNOSIS — Z299 Encounter for prophylactic measures, unspecified: Secondary | ICD-10-CM | POA: Diagnosis not present

## 2020-04-23 DIAGNOSIS — I10 Essential (primary) hypertension: Secondary | ICD-10-CM | POA: Diagnosis not present

## 2020-05-15 DIAGNOSIS — I2510 Atherosclerotic heart disease of native coronary artery without angina pectoris: Secondary | ICD-10-CM | POA: Diagnosis not present

## 2020-05-15 DIAGNOSIS — I10 Essential (primary) hypertension: Secondary | ICD-10-CM | POA: Diagnosis not present

## 2020-06-14 DIAGNOSIS — I2510 Atherosclerotic heart disease of native coronary artery without angina pectoris: Secondary | ICD-10-CM | POA: Diagnosis not present

## 2020-06-14 DIAGNOSIS — I10 Essential (primary) hypertension: Secondary | ICD-10-CM | POA: Diagnosis not present

## 2020-07-18 DIAGNOSIS — L989 Disorder of the skin and subcutaneous tissue, unspecified: Secondary | ICD-10-CM | POA: Diagnosis not present

## 2020-07-18 DIAGNOSIS — Z299 Encounter for prophylactic measures, unspecified: Secondary | ICD-10-CM | POA: Diagnosis not present

## 2020-07-18 DIAGNOSIS — I2510 Atherosclerotic heart disease of native coronary artery without angina pectoris: Secondary | ICD-10-CM | POA: Diagnosis not present

## 2020-07-18 DIAGNOSIS — Z6834 Body mass index (BMI) 34.0-34.9, adult: Secondary | ICD-10-CM | POA: Diagnosis not present

## 2020-07-18 DIAGNOSIS — I10 Essential (primary) hypertension: Secondary | ICD-10-CM | POA: Diagnosis not present

## 2020-07-18 DIAGNOSIS — I700 Atherosclerosis of aorta: Secondary | ICD-10-CM | POA: Diagnosis not present

## 2020-07-25 DIAGNOSIS — Z6832 Body mass index (BMI) 32.0-32.9, adult: Secondary | ICD-10-CM | POA: Diagnosis not present

## 2020-07-25 DIAGNOSIS — D485 Neoplasm of uncertain behavior of skin: Secondary | ICD-10-CM | POA: Diagnosis not present

## 2020-07-25 DIAGNOSIS — Z299 Encounter for prophylactic measures, unspecified: Secondary | ICD-10-CM | POA: Diagnosis not present

## 2020-07-25 DIAGNOSIS — I10 Essential (primary) hypertension: Secondary | ICD-10-CM | POA: Diagnosis not present

## 2020-07-25 DIAGNOSIS — C44329 Squamous cell carcinoma of skin of other parts of face: Secondary | ICD-10-CM | POA: Diagnosis not present

## 2020-08-15 DIAGNOSIS — I2510 Atherosclerotic heart disease of native coronary artery without angina pectoris: Secondary | ICD-10-CM | POA: Diagnosis not present

## 2020-08-15 DIAGNOSIS — I10 Essential (primary) hypertension: Secondary | ICD-10-CM | POA: Diagnosis not present

## 2020-09-13 DIAGNOSIS — I2510 Atherosclerotic heart disease of native coronary artery without angina pectoris: Secondary | ICD-10-CM | POA: Diagnosis not present

## 2020-09-13 DIAGNOSIS — I10 Essential (primary) hypertension: Secondary | ICD-10-CM | POA: Diagnosis not present

## 2020-10-15 DIAGNOSIS — I2510 Atherosclerotic heart disease of native coronary artery without angina pectoris: Secondary | ICD-10-CM | POA: Diagnosis not present

## 2020-10-15 DIAGNOSIS — I10 Essential (primary) hypertension: Secondary | ICD-10-CM | POA: Diagnosis not present

## 2020-10-25 DIAGNOSIS — Z7189 Other specified counseling: Secondary | ICD-10-CM | POA: Diagnosis not present

## 2020-10-25 DIAGNOSIS — Z1339 Encounter for screening examination for other mental health and behavioral disorders: Secondary | ICD-10-CM | POA: Diagnosis not present

## 2020-10-25 DIAGNOSIS — Z125 Encounter for screening for malignant neoplasm of prostate: Secondary | ICD-10-CM | POA: Diagnosis not present

## 2020-10-25 DIAGNOSIS — E7800 Pure hypercholesterolemia, unspecified: Secondary | ICD-10-CM | POA: Diagnosis not present

## 2020-10-25 DIAGNOSIS — R5383 Other fatigue: Secondary | ICD-10-CM | POA: Diagnosis not present

## 2020-10-25 DIAGNOSIS — I10 Essential (primary) hypertension: Secondary | ICD-10-CM | POA: Diagnosis not present

## 2020-10-25 DIAGNOSIS — Z6832 Body mass index (BMI) 32.0-32.9, adult: Secondary | ICD-10-CM | POA: Diagnosis not present

## 2020-10-25 DIAGNOSIS — Z1331 Encounter for screening for depression: Secondary | ICD-10-CM | POA: Diagnosis not present

## 2020-10-25 DIAGNOSIS — Z0000 Encounter for general adult medical examination without abnormal findings: Secondary | ICD-10-CM | POA: Diagnosis not present

## 2020-10-25 DIAGNOSIS — I700 Atherosclerosis of aorta: Secondary | ICD-10-CM | POA: Diagnosis not present

## 2020-10-25 DIAGNOSIS — Z79899 Other long term (current) drug therapy: Secondary | ICD-10-CM | POA: Diagnosis not present

## 2020-10-25 DIAGNOSIS — Z299 Encounter for prophylactic measures, unspecified: Secondary | ICD-10-CM | POA: Diagnosis not present

## 2020-11-14 DIAGNOSIS — I2510 Atherosclerotic heart disease of native coronary artery without angina pectoris: Secondary | ICD-10-CM | POA: Diagnosis not present

## 2020-11-14 DIAGNOSIS — I10 Essential (primary) hypertension: Secondary | ICD-10-CM | POA: Diagnosis not present

## 2021-03-19 ENCOUNTER — Ambulatory Visit (HOSPITAL_COMMUNITY): Admit: 2021-03-19 | Payer: Medicare HMO | Admitting: Internal Medicine

## 2021-03-19 ENCOUNTER — Encounter (HOSPITAL_COMMUNITY): Payer: Self-pay

## 2021-03-19 SURGERY — COLONOSCOPY WITH PROPOFOL
Anesthesia: Monitor Anesthesia Care

## 2021-06-02 DIAGNOSIS — K2970 Gastritis, unspecified, without bleeding: Secondary | ICD-10-CM | POA: Diagnosis not present

## 2021-06-02 DIAGNOSIS — Z89411 Acquired absence of right great toe: Secondary | ICD-10-CM | POA: Diagnosis not present

## 2021-06-02 DIAGNOSIS — Z299 Encounter for prophylactic measures, unspecified: Secondary | ICD-10-CM | POA: Diagnosis not present

## 2021-06-02 DIAGNOSIS — I700 Atherosclerosis of aorta: Secondary | ICD-10-CM | POA: Diagnosis not present

## 2021-08-01 ENCOUNTER — Emergency Department (HOSPITAL_COMMUNITY)
Admission: EM | Admit: 2021-08-01 | Discharge: 2021-08-01 | Disposition: A | Discharge status: Home / Self Care | Payer: Medicare HMO | Attending: Emergency Medicine | Admitting: Emergency Medicine

## 2021-08-01 ENCOUNTER — Emergency Department (HOSPITAL_COMMUNITY): Payer: Medicare HMO

## 2021-08-01 ENCOUNTER — Encounter (HOSPITAL_COMMUNITY): Payer: Self-pay | Admitting: *Deleted

## 2021-08-01 ENCOUNTER — Other Ambulatory Visit: Payer: Self-pay

## 2021-08-01 DIAGNOSIS — K4090 Unilateral inguinal hernia, without obstruction or gangrene, not specified as recurrent: Secondary | ICD-10-CM | POA: Diagnosis not present

## 2021-08-01 DIAGNOSIS — Z7982 Long term (current) use of aspirin: Secondary | ICD-10-CM | POA: Insufficient documentation

## 2021-08-01 DIAGNOSIS — K219 Gastro-esophageal reflux disease without esophagitis: Secondary | ICD-10-CM | POA: Insufficient documentation

## 2021-08-01 DIAGNOSIS — Z955 Presence of coronary angioplasty implant and graft: Secondary | ICD-10-CM | POA: Diagnosis not present

## 2021-08-01 DIAGNOSIS — X500XXA Overexertion from strenuous movement or load, initial encounter: Secondary | ICD-10-CM | POA: Insufficient documentation

## 2021-08-01 DIAGNOSIS — Z87891 Personal history of nicotine dependence: Secondary | ICD-10-CM | POA: Insufficient documentation

## 2021-08-01 DIAGNOSIS — I2510 Atherosclerotic heart disease of native coronary artery without angina pectoris: Secondary | ICD-10-CM | POA: Diagnosis not present

## 2021-08-01 DIAGNOSIS — I10 Essential (primary) hypertension: Secondary | ICD-10-CM | POA: Insufficient documentation

## 2021-08-01 DIAGNOSIS — R109 Unspecified abdominal pain: Secondary | ICD-10-CM | POA: Diagnosis not present

## 2021-08-01 DIAGNOSIS — Z85038 Personal history of other malignant neoplasm of large intestine: Secondary | ICD-10-CM | POA: Insufficient documentation

## 2021-08-01 DIAGNOSIS — R1030 Lower abdominal pain, unspecified: Secondary | ICD-10-CM | POA: Diagnosis not present

## 2021-08-01 LAB — CBC WITH DIFFERENTIAL/PLATELET
Abs Immature Granulocytes: 0.01 10*3/uL (ref 0.00–0.07)
Basophils Absolute: 0.1 10*3/uL (ref 0.0–0.1)
Basophils Relative: 1 %
Eosinophils Absolute: 0.4 10*3/uL (ref 0.0–0.5)
Eosinophils Relative: 6 %
HCT: 40.8 % (ref 39.0–52.0)
Hemoglobin: 13.9 g/dL (ref 13.0–17.0)
Immature Granulocytes: 0 %
Lymphocytes Relative: 21 %
Lymphs Abs: 1.6 10*3/uL (ref 0.7–4.0)
MCH: 32.6 pg (ref 26.0–34.0)
MCHC: 34.1 g/dL (ref 30.0–36.0)
MCV: 95.6 fL (ref 80.0–100.0)
Monocytes Absolute: 0.5 10*3/uL (ref 0.1–1.0)
Monocytes Relative: 7 %
Neutro Abs: 4.9 10*3/uL (ref 1.7–7.7)
Neutrophils Relative %: 65 %
Platelets: 182 10*3/uL (ref 150–400)
RBC: 4.27 MIL/uL (ref 4.22–5.81)
RDW: 14.1 % (ref 11.5–15.5)
WBC: 7.6 10*3/uL (ref 4.0–10.5)
nRBC: 0 % (ref 0.0–0.2)

## 2021-08-01 LAB — BASIC METABOLIC PANEL
Anion gap: 6 (ref 5–15)
BUN: 17 mg/dL (ref 8–23)
CO2: 27 mmol/L (ref 22–32)
Calcium: 9.6 mg/dL (ref 8.9–10.3)
Chloride: 105 mmol/L (ref 98–111)
Creatinine, Ser: 1.12 mg/dL (ref 0.61–1.24)
GFR, Estimated: >60 mL/min (ref >60)
Glucose, Bld: 102 mg/dL — ABNORMAL HIGH (ref 70–99)
Potassium: 4.8 mmol/L (ref 3.5–5.1)
Sodium: 138 mmol/L (ref 135–145)

## 2021-08-01 MED ORDER — IOHEXOL 350 MG/ML SOLN
85.0000 mL | Freq: Once | INTRAVENOUS | Status: AC | PRN
  Administered 2021-08-01: 85 mL via INTRAVENOUS

## 2021-08-01 NOTE — ED Provider Notes (Signed)
[facility] EMERGENCY DEPARTMENT Provider Note   CSN: [id number] Arrival date & time: 08/01/21  1243     History Chief Complaint  Patient presents with   Groin Pain    Brandon [name] is a 80 y.o. male.  HPI  Patient with significant medical history of chest pain, CAD, hypertension presents with chief complaint of a hernia.  Patient states about 2 weeks ago he lifted up a heavy box and felt pain in his left groin.  He states since then he has been having left groin pain, it is worsened when he moves or when he lifts up heavy objects, he describes the pain as a sharp sensation  which will go away on its own.  He does not have associated nausea, vomiting, diarrhea, denies constipation states he still passing gas.  Patient does not endorse coughing and/or straining makes the pain any worse or better.  He denies testicular pain or penile pain denies urinary symptoms.  Patient has significant surgical history including appendicectomy, cholecystectomy, right inguinal hernia repair, he has not been diagnosed with a left-sided hernia, has no other complaints at this time.  Patient states his pain has improved.  Patient denies chest pain, short of breath, abdominal pain, nausea, vomit, diarrhea, worsening pedal edema.  Past Medical History:  Diagnosis Date   Chest pain, unspecified    Colon cancer ([place]) 1999   Coronary artery disease    GERD (gastroesophageal reflux disease)    History of gout    History of herniorrhaphy    Hypercholesteremia    Hypertension     Patient Active Problem List   Diagnosis Date Noted   Common bile duct stone 07/20/2018   HTN (hypertension) 06/14/2018   GERD (gastroesophageal reflux disease) 06/14/2018   Jaundice 06/14/2018   Cholelithiasis with choledocholithiasis 06/14/2018   HLD (hyperlipidemia) 06/14/2018   Medication management 08/27/2014   Colon cancer ([place]) 02/09/2009   CHEST PAIN-UNSPECIFIED 02/09/2009   HERNIORRHAPHY, HX OF 02/09/2009     Past Surgical History:  Procedure Laterality Date   AMPUTATION OF REPLICATED TOES Right    big toe   APPENDECTOMY     BALLOON DILATION N/A 08/01/2018   Procedure: BALLOON DILATION;  Surgeon: Rogene Houston, MD;  Location: [facility];  Service: Endoscopy;  Laterality: N/A;   BILIARY STENT PLACEMENT N/A 06/15/2018   Procedure: BILIARY STENT PLACEMENT;  Surgeon: Rogene Houston, MD;  Location: [facility];  Service: Endoscopy;  Laterality: N/A;   CARDIAC CATHETERIZATION     stents x2   CHOLECYSTECTOMY N/A 06/17/2018   Procedure: ATTEMPTED LAPAROSCOPIC CHOLECYSTECTOMY,;  Surgeon: Aviva Signs, MD;  Location: [facility] ORS;  Service: General;  Laterality: N/A;   CHOLECYSTECTOMY N/A 06/17/2018   Procedure: CHOLECYSTECTOMY;  Surgeon: Aviva Signs, MD;  Location: [facility] ORS;  Service: General;  Laterality: N/A;   COLONOSCOPY N/A 11/21/2014   Procedure: COLONOSCOPY;  Surgeon: Rogene Houston, MD;  Location: [facility];  Service: Endoscopy;  Laterality: N/A;  830   CORONARY ANGIOPLASTY     ERCP N/A 06/15/2018   Procedure: ENDOSCOPIC RETROGRADE CHOLANGIOPANCREATOGRAPHY (ERCP);  Surgeon: Rogene Houston, MD;  [facility]: [facility];  Service: Endoscopy;  Laterality: N/A;   ERCP N/A 08/01/2018   Procedure: ENDOSCOPIC RETROGRADE CHOLANGIOPANCREATOGRAPHY (ERCP);  Surgeon: Rogene Houston, MD;  [facility];  Service: Endoscopy;  Laterality: N/A;   heart stents     HERNIA REPAIR     Incisional hernia   INGUINAL HERNIA REPAIR Right 09/02/2016  Procedure: HERNIA REPAIR INGUINAL ADULT WITH MESH;  Surgeon: Aviva Signs, MD;  Location: [facility];  Service: General;  Laterality: Right;   Left foot surgery Left    lawn mower accidnet   PARTIAL COLECTOMY     REMOVAL OF STONES N/A 08/01/2018   Procedure: REMOVAL OF STONES;  Surgeon: Rogene Houston, MD;  [facility]: [facility];  Service: Endoscopy;  Laterality: N/A;   Right great toe surgery     SPHINCTEROTOMY N/A 06/15/2018   Procedure:  SPHINCTEROTOMY;  Surgeon: Rogene Houston, MD;  [facility];  Service: Endoscopy;  Laterality: N/A;   SPHINCTEROTOMY N/A 08/01/2018   Procedure: SPHINCTEROTOMY;  Surgeon: Rogene Houston, MD;  [facility];  Service: Endoscopy;  Laterality: N/A;   SPYGLASS CHOLANGIOSCOPY N/A 08/01/2018   Procedure: SPYGLASS CHOLANGIOSCOPY;  Surgeon: Rogene Houston, MD;  [facility];  Service: Endoscopy;  Laterality: N/A;   STENT REMOVAL N/A 08/01/2018   Procedure: BILIARY STENT REMOVAL;  Surgeon: Rogene Houston, MD;  [facility];  Service: Endoscopy;  Laterality: N/A;   TONSILLECTOMY         Family History  Problem Relation Age of Onset   Cerebral aneurysm Mother    Alzheimer's disease Father     Social History   Tobacco Use   Smoking status: Former    Packs/day: 1.00    Years: 30.00    Pack years: 30.00    Types: Cigarettes    Quit date: 08/28/2009    Years since quitting: 11.9   Smokeless tobacco: Current    Types: Snuff  Vaping Use   Vaping Use: Never used  Substance Use Topics   Alcohol use: Yes    Comment: socially   Drug use: No    Home Medications Prior to Admission medications   Medication Sig Start Date End Date Taking? Authorizing Provider  aspirin 325 MG tablet Take 1 tablet (325 mg total) by mouth daily. 08/04/18  Yes Rehman, Mechele Dawley, MD  clobetasol (OLUX) 0.05 % topical foam Apply 1 application topically 2 (two) times daily as needed (rash).    Yes [provider]  clobetasol cream (TEMOVATE) 0.05 % Apply topically 2 (two) times daily. 04/02/21  Yes [provider]  famotidine (PEPCID) 10 MG tablet Take 10 mg by mouth 2 (two) times daily as needed for heartburn or indigestion.    Yes [provider]  loperamide (IMODIUM A-D) 2 MG tablet Take 1 tablet (2 mg total) by mouth daily as needed for diarrhea or loose stools. 07/19/18  Yes Rehman, Mechele Dawley, MD  nitroGLYCERIN (NITROSTAT) 0.4 MG SL tablet Place  0.4 mg under the tongue every 5 (five) minutes as needed for chest pain.    Yes [provider]  Omega-3 Krill Oil 450 MG CAPS Take by mouth daily. This is Omega 4  [employer].   Yes [provider]  OVER THE COUNTER MEDICATION Take 10 drops by mouth daily. Liquid Zinc   Yes [provider]  OVER THE COUNTER MEDICATION Take 14 drops by mouth daily. Vitamin d3 and k2 combination.   Yes [provider]  simvastatin (ZOCOR) 20 MG tablet Take 20 mg by mouth at bedtime.    Yes [provider]  tamsulosin (FLOMAX) 0.4 MG CAPS capsule Take 1 capsule (0.4 mg total) by mouth daily. 06/30/18  Yes Aviva Signs, MD  vitamin B-12 (CYANOCOBALAMIN) 1000 MCG tablet Take 1,000 mcg by mouth daily.   Yes  [provider]  b complex vitamins capsule Take 1 capsule by mouth 2 (two) times daily. Patient not taking: Reported on 08/01/2021    [provider]  metroNIDAZOLE (FLAGYL) 500 MG tablet Take 500 mg by mouth 3 (three) times daily. Patient not taking: Reported on 08/01/2021 06/02/21   [provider]    Allergies    Oxycodone and Oxycontin [oxycodone hcl]  Review of Systems   Review of Systems  Constitutional:  Negative for chills and fever.  HENT:  Negative for congestion.   Respiratory:  Negative for shortness of breath.   Cardiovascular:  Negative for chest pain.  Gastrointestinal:  Negative for abdominal pain, diarrhea, nausea and vomiting.  Genitourinary:  Negative for enuresis, flank pain, penile swelling and scrotal swelling.  Musculoskeletal:  Negative for back pain.  Skin:  Negative for rash.  Neurological:  Negative for headaches.  Hematological:  Does not bruise/bleed easily.   Physical Exam Updated Vital Signs BP (!) 144/79   Pulse 62   Temp 98.6 F (37 C)   Resp 14   Ht '5\' 7"'$  (1.702 m)   Wt 84 kg   SpO2 99%   BMI 29.00 kg/m   Physical Exam Vitals and nursing note reviewed. Exam conducted with a chaperone  present.  Constitutional:      General: He is not in acute distress.    Appearance: He is not ill-appearing.  HENT:     Head: Normocephalic and atraumatic.     Nose: No congestion.  Eyes:     Conjunctiva/sclera: Conjunctivae normal.  Cardiovascular:     Rate and Rhythm: Normal rate and regular rhythm.     Pulses: Normal pulses.     Heart sounds: No murmur heard.   No friction rub. No gallop.  Pulmonary:     Effort: No respiratory distress.     Breath sounds: No wheezing, rhonchi or rales.  Abdominal:     Palpations: Abdomen is soft.     Tenderness: There is no abdominal tenderness. There is no right CVA tenderness or left CVA tenderness.     Comments: Abdomen nondistended, normal bowel sounds, dull to percussion, nontender to palpation, no step-off or deformities present.  Genitourinary:    Penis: Normal.      Testes: Normal.     Comments: With chaperone present genital exam was performed no gross abnormalities present of the scrotum or the penile shaft.  Testicles are nontender to palpation.  Patient had no palpable inguinal hernia present my exam.  He does have a mass noted on the border of the left pelvic region, it was soft movable, nontender to palpation. Skin:    General: Skin is warm and dry.  Neurological:     Mental Status: He is alert.  Psychiatric:        Mood and Affect: Mood normal.    ED Results / Procedures / Treatments   Labs (all labs ordered are listed, but only abnormal results are displayed) Labs Reviewed  BASIC METABOLIC PANEL - Abnormal; Notable for the following components:      Result Value   Glucose, Bld 102 (*)    All other components within normal limits  CBC WITH DIFFERENTIAL/PLATELET    EKG None  Radiology CT ABDOMEN PELVIS W CONTRAST  Result Date: 08/01/2021 CLINICAL DATA:  Abdominal pain, left groin pain. EXAM: CT ABDOMEN AND PELVIS WITH CONTRAST TECHNIQUE: Multidetector CT imaging of the abdomen and pelvis was performed using the  standard protocol following bolus administration of  intravenous contrast. CONTRAST:  23m OMNIPAQUE IOHEXOL 350 MG/ML SOLN COMPARISON:  CT abdomen and pelvis 05/04/2005. FINDINGS: Lower chest: There are two 3 mm nodular densities in the right middle lobe which were not seen on the prior study. The visualized lung bases are otherwise clear. Hepatobiliary: The gallbladder is now surgically absent. There is no biliary ductal dilatation. There is pneumobilia in the left lobe of the liver which may be related to prior sphincterotomy. No focal liver lesions are identified. Pancreas: Unremarkable. No pancreatic ductal dilatation or surrounding inflammatory changes. Spleen: Normal in size without focal abnormality. Adrenals/Urinary Tract: Bilateral adrenal glands are within normal limits. There are extrarenal pelvis ease in both kidneys. There is no hydronephrosis. Kidneys otherwise appear within normal limits. Bladder is within normal limits. Stomach/Bowel: Sigmoid colon anastomosis is present. There is no bowel obstruction. The appendix is not visualized. There are no focal inflammatory changes. Small bowel and stomach are within normal limits. Small bowel diverticulum is present in the left abdomen image 2/46. Vascular/Lymphatic: Aortic atherosclerosis. No enlarged abdominal or pelvic lymph nodes. Reproductive: The prostate gland is enlarged measuring 5.6 cm in transverse dimension. Other: There is a small fat containing left inguinal hernia. There is no ascites. Anterior abdominal wall mesh is present. Musculoskeletal: Multilevel degenerative changes affect the spine. There is a stable small sclerotic density in the left iliac bone which may represent a bone island. IMPRESSION: 1. Small fat containing left inguinal hernia. 2. Patient is status post cholecystectomy. There is left-sided pneumobilia which may be related to prior sphincterotomy. Correlate clinically. 3. There are 3 mm nodular densities in the right middle  lobe, new from 2006. No follow-up needed if patient is low-risk (and has no known or suspected primary neoplasm). Non-contrast chest CT can be considered in 12 months if patient is high-risk. This recommendation follows the consensus statement: Guidelines for Management of Incidental Pulmonary Nodules Detected on CT Images: From the Fleischner Society 2017; Radiology 2017; 284:228-243. Electronically Signed   By: ARonney AstersM.D.   On: 08/01/2021 16:13    Procedures Procedures   Medications Ordered in ED Medications  iohexol (OMNIPAQUE) 350 MG/ML injection 85 mL (85 mLs Intravenous Contrast Given 08/01/21 1535)    ED Course  I have reviewed the triage vital signs and the nursing notes.  Pertinent labs & imaging results that were available during my care of the patient were reviewed by me and considered in my medical decision making (see chart for details).    MDM Rules/Calculators/A&P                           Initial impression-patient presents with concerns of a inguinal hernia.  He is alert, does not appear acute stress, vital signs reassuring.  Will obtain basic lab work-up, CT ab pelvis and reassess.  Work-up-CBC unremarkable, BMP shows slight hyperglycemia 102.  CT abdomen pelvis reveals a small fat-containing left inguinal hernia, CT on pelvis reveals 3 mm nodule densities in the right middle lobe  Rule out-low suspicion for bowel obstruction, complicated diverticulitis, intra-abdominal abscess, pancreatitis, Pilo, kidney stone as CT imaging is all negative for acute findings.  Low suspicion for strangulated hernia as there is no ischemic changes seen on CT abdomen pelvis.  Plan-  Left inguinal pain-likely caused by the small left inguinal hernia, will recommend over-the-counter pain medications, follow-up with general surgery for further evaluation. Small lung nodules-patient was made aware this, we will have him follow-up PCP for further  evaluation.  Vital signs have remained  stable, no indication for hospital admission.    Patient given at home care as well strict return precautions.  Patient verbalized that they understood agreed to said plan.  Final Clinical Impression(s) / ED Diagnoses Final diagnoses:  Non-recurrent unilateral inguinal hernia without obstruction or gangrene    Rx / DC Orders ED Discharge Orders     None        Marcello Fennel, PA-C 08/01/21 1721    Milton Ferguson, MD 08/04/21 [phone]

## 2021-08-01 NOTE — Discharge Instructions (Addendum)
Imaging reveals that you have a small left inguinal hernia recommend over-the-counter pain medication as needed, please refrain from heavy lifting or increasing the intra-abdominal pressure i.e. bearing down, sneezing, coughing.  I have also recommend wearing a hernia belt you can buy this on [employer] or your local drugstore.  Please follow-up with Dr. Arnoldo Morale for further evaluation.  CT scan shows small nonspecific lung nodules recommend that you have these rechecked in 1 years time noncontrasted CT chest please follow your PCP for this.  Come back to the emergency department if you develop chest pain, shortness of breath, severe abdominal pain, uncontrolled nausea, vomiting, diarrhea.

## 2021-08-01 NOTE — ED Triage Notes (Signed)
Pain in groin area after lifting heavy object, worse with movement

## 2021-08-01 NOTE — ED Notes (Signed)
Patient transported to CT 

## 2021-08-06 DIAGNOSIS — R911 Solitary pulmonary nodule: Secondary | ICD-10-CM | POA: Diagnosis not present

## 2021-08-06 DIAGNOSIS — Z299 Encounter for prophylactic measures, unspecified: Secondary | ICD-10-CM | POA: Diagnosis not present

## 2021-08-06 DIAGNOSIS — I10 Essential (primary) hypertension: Secondary | ICD-10-CM | POA: Diagnosis not present

## 2021-08-06 DIAGNOSIS — Z6828 Body mass index (BMI) 28.0-28.9, adult: Secondary | ICD-10-CM | POA: Diagnosis not present

## 2021-08-06 DIAGNOSIS — K4090 Unilateral inguinal hernia, without obstruction or gangrene, not specified as recurrent: Secondary | ICD-10-CM | POA: Diagnosis not present

## 2021-08-19 ENCOUNTER — Encounter: Payer: Self-pay | Admitting: General Surgery

## 2021-08-19 ENCOUNTER — Ambulatory Visit: Payer: Medicare HMO | Admitting: General Surgery

## 2021-08-19 ENCOUNTER — Other Ambulatory Visit: Payer: Self-pay

## 2021-08-19 VITALS — BP 141/81 | HR 60 | Temp 98.3°F | Resp 16 | Ht 67.0 in | Wt 194.0 lb

## 2021-08-19 DIAGNOSIS — K4090 Unilateral inguinal hernia, without obstruction or gangrene, not specified as recurrent: Secondary | ICD-10-CM | POA: Diagnosis not present

## 2021-08-19 NOTE — Progress Notes (Signed)
Brandon [name]; [id number]; 1941/05/19   HPI Patient is a 80 year old white male who was referred to my care by Dr. Manuella Ghazi for evaluation and treatment of a left inguinal hernia.  This was initially noted 1 month ago.  He was having left groin pain.  He has since bought a truss and has minimal discomfort in the left groin region.  It is not affecting his daily activity.  When he lays down at night, the hernia reduces on its own.  He denies any nausea or vomiting. Past Medical History:  Diagnosis Date   Chest pain, unspecified    Colon cancer ([place]) 1999   Coronary artery disease    GERD (gastroesophageal reflux disease)    History of gout    History of herniorrhaphy    Hypercholesteremia    Hypertension     Past Surgical History:  Procedure Laterality Date   AMPUTATION OF REPLICATED TOES Right    big toe   APPENDECTOMY     BALLOON DILATION N/A 08/01/2018   Procedure: BALLOON DILATION;  Surgeon: Rogene Houston, MD;  [facility];  Service: Endoscopy;  Laterality: N/A;   BILIARY STENT PLACEMENT N/A 06/15/2018   Procedure: BILIARY STENT PLACEMENT;  Surgeon: Rogene Houston, MD;  [facility];  Service: Endoscopy;  Laterality: N/A;   CARDIAC CATHETERIZATION     stents x2   CHOLECYSTECTOMY N/A 06/17/2018   Procedure: ATTEMPTED LAPAROSCOPIC CHOLECYSTECTOMY,;  Surgeon: Aviva Signs, MD;  Location: [facility];  Service: General;  Laterality: N/A;   CHOLECYSTECTOMY N/A 06/17/2018   Procedure: CHOLECYSTECTOMY;  Surgeon: Aviva Signs, MD;  Location: [facility];  Service: General;  Laterality: N/A;   COLONOSCOPY N/A 11/21/2014   Procedure: COLONOSCOPY;  Surgeon: Rogene Houston, MD;  [facility];  Service: Endoscopy;  Laterality: N/A;  830   CORONARY ANGIOPLASTY     ERCP N/A 06/15/2018   Procedure: ENDOSCOPIC RETROGRADE CHOLANGIOPANCREATOGRAPHY (ERCP);  Surgeon: Rogene Houston, MD;  [facility];  Service: Endoscopy;  Laterality: N/A;   ERCP N/A 08/01/2018    Procedure: ENDOSCOPIC RETROGRADE CHOLANGIOPANCREATOGRAPHY (ERCP);  Surgeon: Rogene Houston, MD;  [facility];  Service: Endoscopy;  Laterality: N/A;   heart stents     HERNIA REPAIR     Incisional hernia   INGUINAL HERNIA REPAIR Right 09/02/2016   Procedure: HERNIA REPAIR INGUINAL ADULT WITH MESH;  Surgeon: Aviva Signs, MD;  Location: [facility];  Service: General;  Laterality: Right;   Left foot surgery Left    lawn mower accidnet   PARTIAL COLECTOMY     REMOVAL OF STONES N/A 08/01/2018   Procedure: REMOVAL OF STONES;  Surgeon: Rogene Houston, MD;  [facility];  Service: Endoscopy;  Laterality: N/A;   Right great toe surgery     SPHINCTEROTOMY N/A 06/15/2018   Procedure: SPHINCTEROTOMY;  Surgeon: Rogene Houston, MD;  [facility];  Service: Endoscopy;  Laterality: N/A;   SPHINCTEROTOMY N/A 08/01/2018   Procedure: SPHINCTEROTOMY;  Surgeon: Rogene Houston, MD;  [facility];  Service: Endoscopy;  Laterality: N/A;   SPYGLASS CHOLANGIOSCOPY N/A 08/01/2018   Procedure: SPYGLASS CHOLANGIOSCOPY;  Surgeon: Rogene Houston, MD;  [facility];  Service: Endoscopy;  Laterality: N/A;   STENT REMOVAL N/A 08/01/2018   Procedure: BILIARY STENT REMOVAL;  Surgeon: Rogene Houston, MD;  [facility];  Service: Endoscopy;  Laterality: N/A;   TONSILLECTOMY  Family History  Problem Relation Age of Onset   Cerebral aneurysm Mother    Alzheimer's disease Father     Current Outpatient Medications on File Prior to Visit  Medication Sig Dispense Refill   aspirin 325 MG tablet Take 1 tablet (325 mg total) by mouth daily.     clobetasol (OLUX) 0.05 % topical foam Apply 1 application topically 2 (two) times daily as needed (rash).      clobetasol cream (TEMOVATE) 0.05 % Apply topically 2 (two) times daily.     famotidine (PEPCID) 10 MG tablet Take 10 mg by mouth 2 (two) times daily as needed for heartburn or indigestion.      loperamide  (IMODIUM A-D) 2 MG tablet Take 1 tablet (2 mg total) by mouth daily as needed for diarrhea or loose stools. 10 tablet 0   nitroGLYCERIN (NITROSTAT) 0.4 MG SL tablet Place 0.4 mg under the tongue every 5 (five) minutes as needed for chest pain.      Omega-3 Krill Oil 450 MG CAPS Take by mouth daily. This is Omega 4  [employer].     OVER THE COUNTER MEDICATION Take 10 drops by mouth daily. Liquid Zinc     OVER THE COUNTER MEDICATION Take 14 drops by mouth daily. Vitamin d3 and k2 combination.     simvastatin (ZOCOR) 20 MG tablet Take 20 mg by mouth at bedtime.      tamsulosin (FLOMAX) 0.4 MG CAPS capsule Take 1 capsule (0.4 mg total) by mouth daily. 30 capsule 1   vitamin B-12 (CYANOCOBALAMIN) 1000 MCG tablet Take 1,000 mcg by mouth daily.     b complex vitamins capsule Take 1 capsule by mouth 2 (two) times daily. (Patient not taking: No sig reported)     metroNIDAZOLE (FLAGYL) 500 MG tablet Take 500 mg by mouth 3 (three) times daily. (Patient not taking: No sig reported)     No current facility-administered medications on file prior to visit.    Allergies  Allergen Reactions   Oxycodone    Oxycontin [Oxycodone Hcl]     Hallucinations     Social History   Substance and Sexual Activity  Alcohol Use Yes   Comment: socially    Social History   Tobacco Use  Smoking Status Former   Packs/day: 1.00   Years: 30.00   Pack years: 30.00   Types: Cigarettes   Quit date: 08/28/2009   Years since quitting: 11.9  Smokeless Tobacco Current   Types: Snuff    Review of Systems  Constitutional: Negative.   HENT: Negative.    Eyes: Negative.   Respiratory: Negative.    Cardiovascular: Negative.   Gastrointestinal: Negative.   Genitourinary: Negative.   Musculoskeletal: Negative.   Skin: Negative.   Neurological: Negative.   Endo/Heme/Allergies: Negative.   Psychiatric/Behavioral: Negative.     Objective   Vitals:   08/19/21 1333  BP: (!) 141/81  Pulse: 60  Resp: 16  Temp:  98.3 F (36.8 C)  SpO2: 96%    Physical Exam Vitals reviewed.  Constitutional:      Appearance: Normal appearance. He is not ill-appearing.  HENT:     Head: Normocephalic and atraumatic.  Cardiovascular:     Rate and Rhythm: Normal rate. Rhythm irregular.     Heart sounds: Normal heart sounds. No murmur heard.   No friction rub. No gallop.  Pulmonary:     Effort: Pulmonary effort is normal. No respiratory distress.     Breath sounds: Normal breath sounds. No  stridor. No wheezing, rhonchi or rales.  Abdominal:     General: Bowel sounds are normal. There is no distension.     Palpations: Abdomen is soft. There is no mass.     Tenderness: There is no abdominal tenderness. There is no guarding or rebound.     Hernia: A hernia is present.     Comments: Easily reducible left inguinal hernia.  Genitourinary:    Testes: Normal.  Skin:    General: Skin is warm and dry.  Neurological:     Mental Status: He is alert and oriented to person, place, and time.  ER notes reviewed  Assessment  Left inguinal hernia, currently asymptomatic Plan  Patient states the truss is helping and he is able to function normally.  I told him he did not need repair of the hernia unless he develops more symptoms and/or if it starts affecting his quality of life.  He understands this.  He will call me should he become more symptomatic and want surgical intervention.

## 2021-10-27 DIAGNOSIS — Z0000 Encounter for general adult medical examination without abnormal findings: Secondary | ICD-10-CM | POA: Diagnosis not present

## 2021-10-27 DIAGNOSIS — Z125 Encounter for screening for malignant neoplasm of prostate: Secondary | ICD-10-CM | POA: Diagnosis not present

## 2021-10-27 DIAGNOSIS — Z79899 Other long term (current) drug therapy: Secondary | ICD-10-CM | POA: Diagnosis not present

## 2021-10-27 DIAGNOSIS — R5383 Other fatigue: Secondary | ICD-10-CM | POA: Diagnosis not present

## 2021-10-27 DIAGNOSIS — I10 Essential (primary) hypertension: Secondary | ICD-10-CM | POA: Diagnosis not present

## 2021-10-27 DIAGNOSIS — Z1339 Encounter for screening examination for other mental health and behavioral disorders: Secondary | ICD-10-CM | POA: Diagnosis not present

## 2021-10-27 DIAGNOSIS — Z299 Encounter for prophylactic measures, unspecified: Secondary | ICD-10-CM | POA: Diagnosis not present

## 2021-10-27 DIAGNOSIS — Z87891 Personal history of nicotine dependence: Secondary | ICD-10-CM | POA: Diagnosis not present

## 2021-10-27 DIAGNOSIS — E7800 Pure hypercholesterolemia, unspecified: Secondary | ICD-10-CM | POA: Diagnosis not present

## 2021-10-27 DIAGNOSIS — Z7189 Other specified counseling: Secondary | ICD-10-CM | POA: Diagnosis not present

## 2021-10-27 DIAGNOSIS — Z6828 Body mass index (BMI) 28.0-28.9, adult: Secondary | ICD-10-CM | POA: Diagnosis not present

## 2021-10-27 DIAGNOSIS — Z1331 Encounter for screening for depression: Secondary | ICD-10-CM | POA: Diagnosis not present

## 2022-05-11 IMAGING — CT CT ABD-PELV W/ CM
2 of 5 series · 16 of 46 positions shown, 18 images · IV contrast (omnipaque)
Comparison: CT abdomen and pelvis 05/04/2005.

CLINICAL DATA: Abdominal pain, left groin pain.

EXAM:
CT ABDOMEN AND PELVIS WITH CONTRAST
TECHNIQUE: Multidetector CT imaging of the abdomen and pelvis was performed
using the standard protocol following bolus administration of
intravenous contrast.
CONTRAST:  85mL OMNIPAQUE IOHEXOL 350 MG/ML SOLN

[Series 2: axial st · axial · 0.88mm/px · z∈[+962,+1388]mm · 13 of 97 slices shown, 15 images]
[im 6/97  soft-tissue]
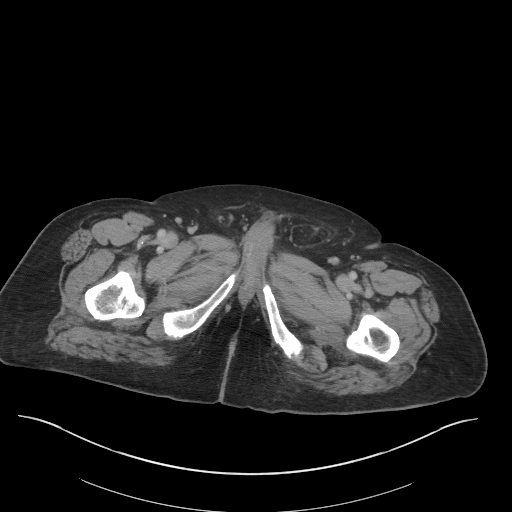
[im 6/97  bone]
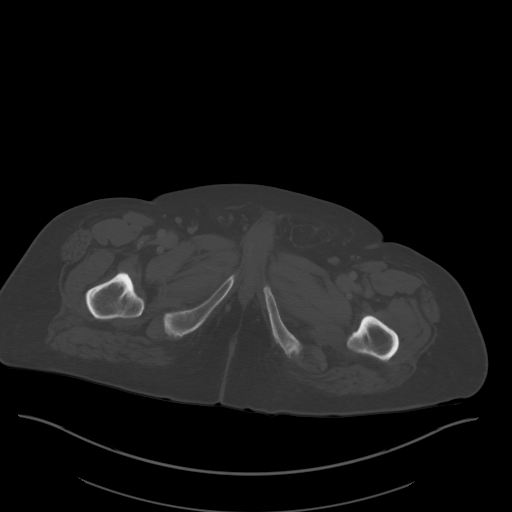
[im 12/97  soft-tissue]
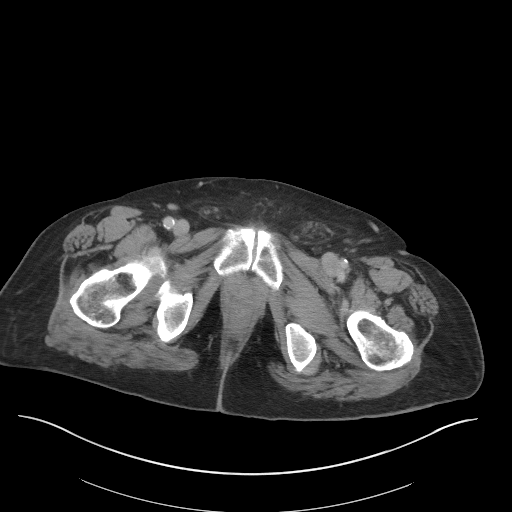
[im 23/97  soft-tissue]
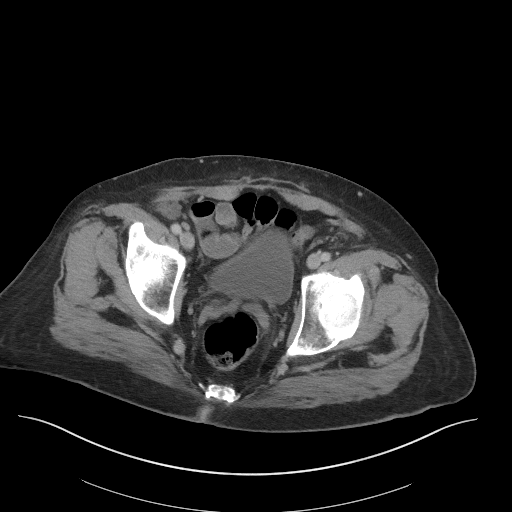
[im 29/97  soft-tissue]
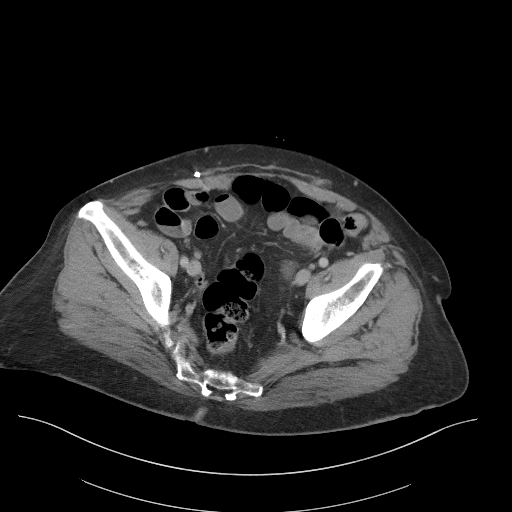
[im 34/97  soft-tissue]
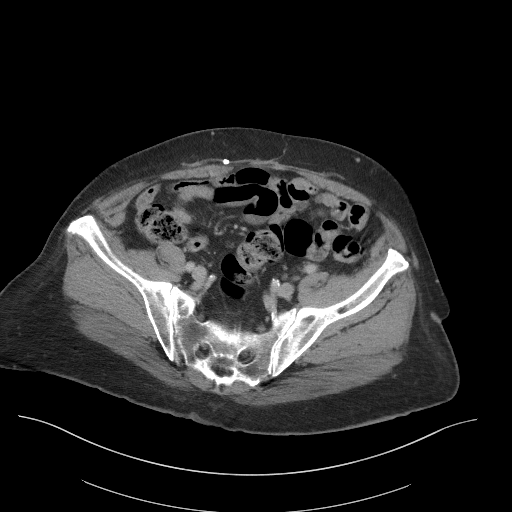
[im 40/97  soft-tissue]
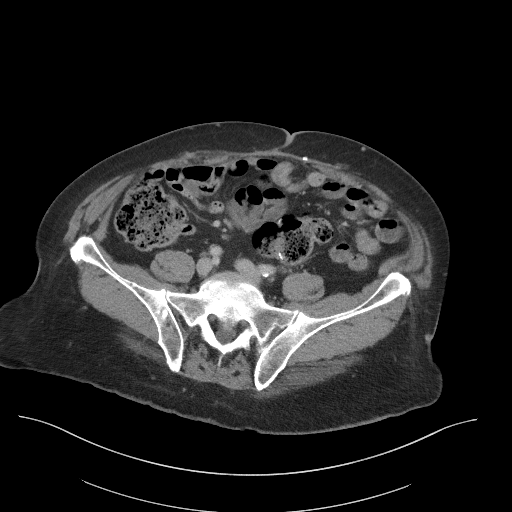
[im 51/97  soft-tissue]
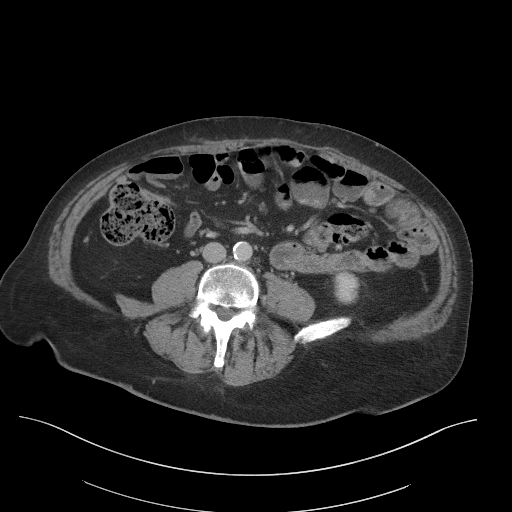
[im 57/97  soft-tissue]
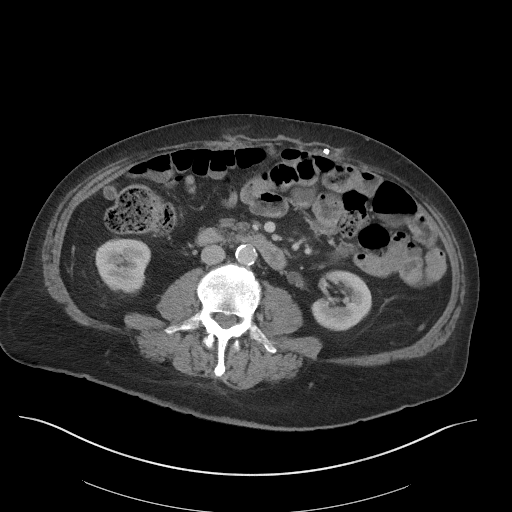
[im 63/97  soft-tissue]
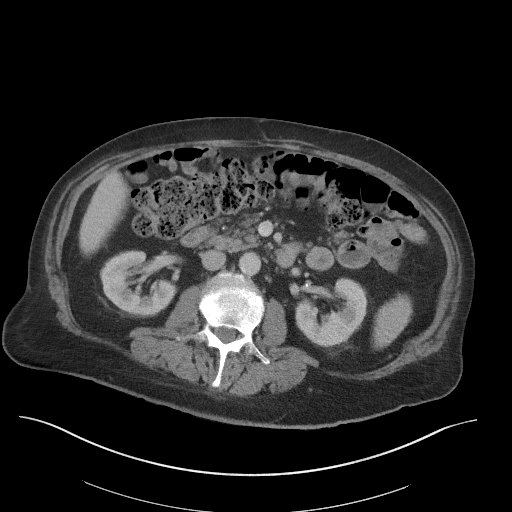
[im 63/97  bone]
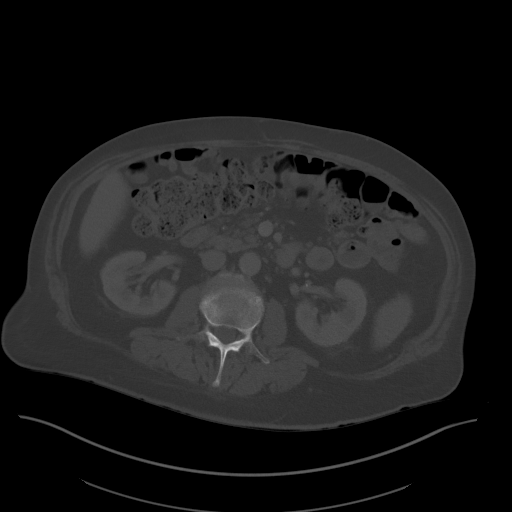
[im 68/97  soft-tissue]
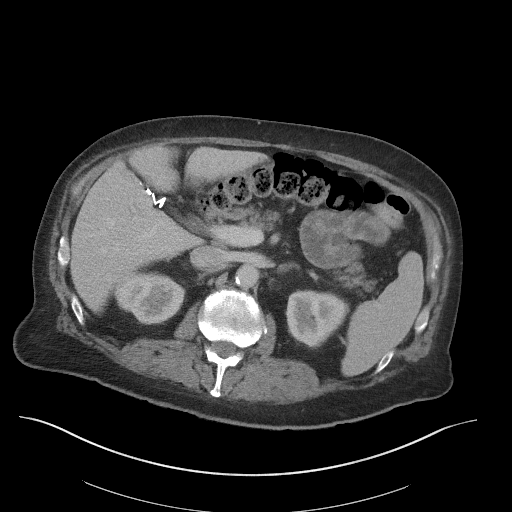
[im 74/97  soft-tissue]
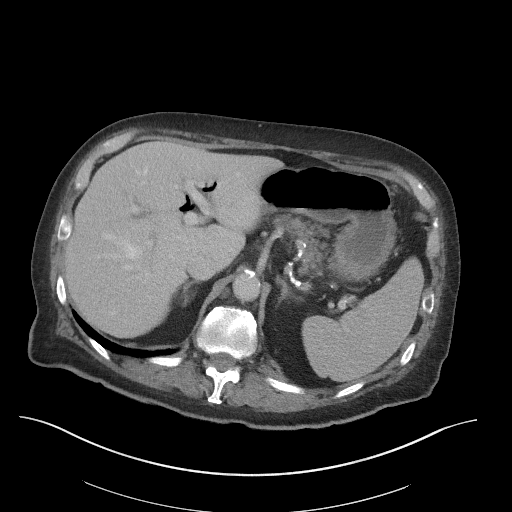
[im 85/97  soft-tissue]
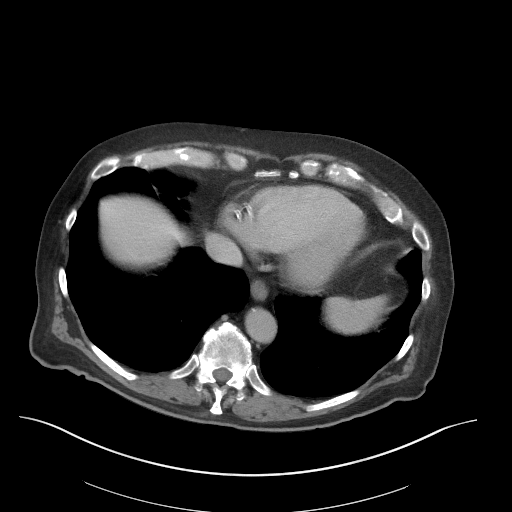
[im 91/97  soft-tissue]
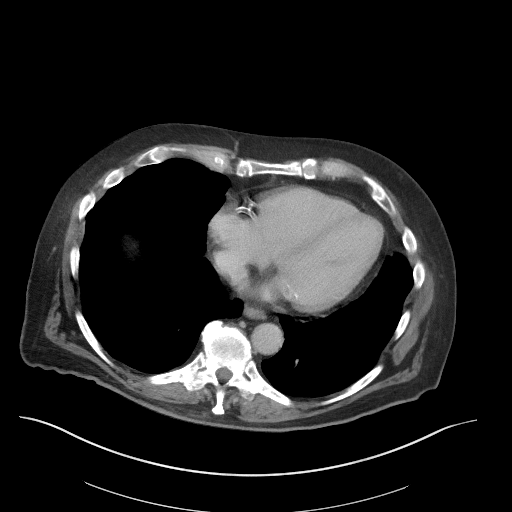

[Series 5: coronal st · coronal · 0.88mm/px · 3 of 101 slices shown]
[im 34/101  soft-tissue]
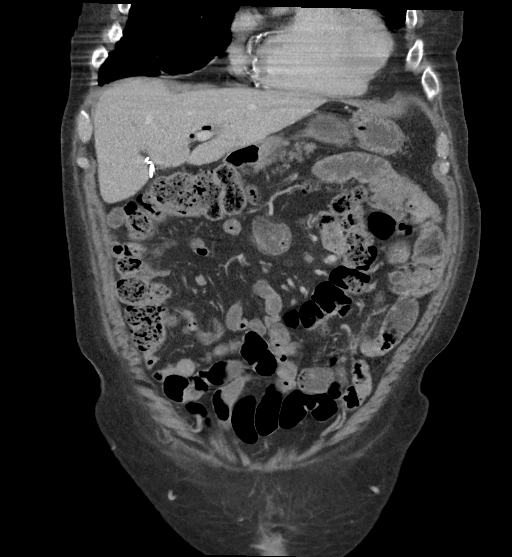
[im 45/101  soft-tissue]
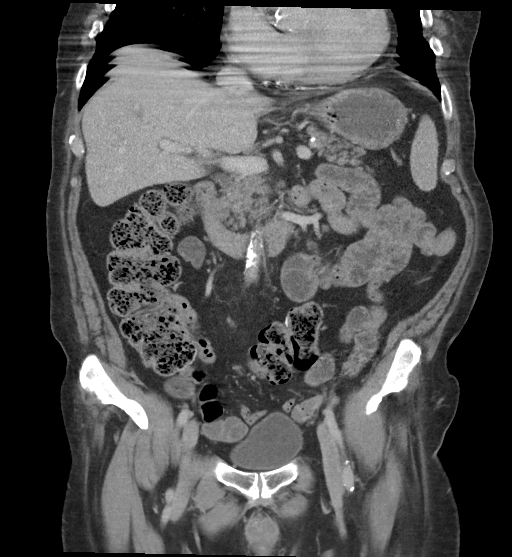
[im 56/101  soft-tissue]
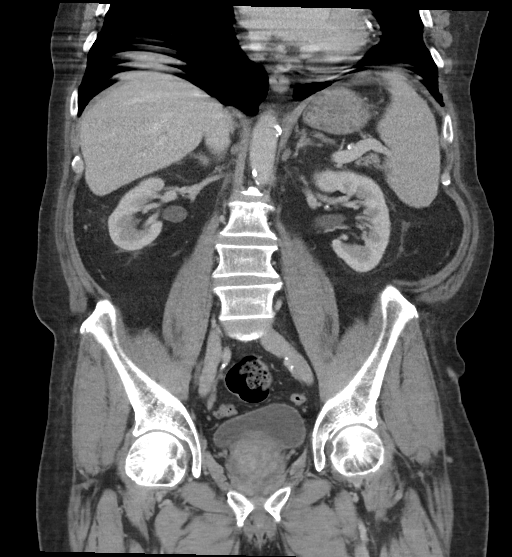

[16 of 46 positions shown; findings below may reference images not displayed]

FINDINGS: Lower chest: There are two 3 mm nodular densities in the right
middle lobe which were not seen on the prior study. The visualized
lung bases are otherwise clear.

Hepatobiliary: The gallbladder is now surgically absent. There is no
biliary ductal dilatation. There is pneumobilia in the left lobe of
the liver which may be related to prior sphincterotomy. No focal
liver lesions are identified.

Pancreas: Unremarkable. No pancreatic ductal dilatation or
surrounding inflammatory changes.

Spleen: Normal in size without focal abnormality.

Adrenals/Urinary Tract: Bilateral adrenal glands are within normal
limits. There are extrarenal pelvis ease in both kidneys. There is
no hydronephrosis. Kidneys otherwise appear within normal limits.
Bladder is within normal limits.

Stomach/Bowel: Sigmoid colon anastomosis is present. There is no
bowel obstruction. The appendix is not visualized. There are no
focal inflammatory changes. Small bowel and stomach are within
normal limits. Small bowel diverticulum is present in the left
abdomen image 2/46.

Vascular/Lymphatic: Aortic atherosclerosis. No enlarged abdominal or
pelvic lymph nodes.

Reproductive: The prostate gland is enlarged measuring 5.6 cm in
transverse dimension.

Other: There is a small fat containing left inguinal hernia. There
is no ascites. Anterior abdominal wall mesh is present.

Musculoskeletal: Multilevel degenerative changes affect the spine.
There is a stable small sclerotic density in the left iliac bone
which may represent a bone island.
IMPRESSION: 1. Small fat containing left inguinal hernia.
2. Patient is status post cholecystectomy. There is left-sided
pneumobilia which may be related to prior sphincterotomy. Correlate
clinically.
3. There are 3 mm nodular densities in the right middle lobe, new
from 0773. No follow-up needed if patient is low-risk (and has no
known or suspected primary neoplasm). Non-contrast chest CT can be
considered in 12 months if patient is high-risk. This recommendation
follows the consensus statement: Guidelines for Management of
Incidental Pulmonary Nodules Detected on CT Images: From the

## 2022-11-02 DIAGNOSIS — N400 Enlarged prostate without lower urinary tract symptoms: Secondary | ICD-10-CM | POA: Diagnosis not present

## 2022-11-02 DIAGNOSIS — Z299 Encounter for prophylactic measures, unspecified: Secondary | ICD-10-CM | POA: Diagnosis not present

## 2022-11-02 DIAGNOSIS — Z1331 Encounter for screening for depression: Secondary | ICD-10-CM | POA: Diagnosis not present

## 2022-11-02 DIAGNOSIS — E7800 Pure hypercholesterolemia, unspecified: Secondary | ICD-10-CM | POA: Diagnosis not present

## 2022-11-02 DIAGNOSIS — Z6829 Body mass index (BMI) 29.0-29.9, adult: Secondary | ICD-10-CM | POA: Diagnosis not present

## 2022-11-02 DIAGNOSIS — R5383 Other fatigue: Secondary | ICD-10-CM | POA: Diagnosis not present

## 2022-11-02 DIAGNOSIS — Z7189 Other specified counseling: Secondary | ICD-10-CM | POA: Diagnosis not present

## 2022-11-02 DIAGNOSIS — Z0000 Encounter for general adult medical examination without abnormal findings: Secondary | ICD-10-CM | POA: Diagnosis not present

## 2022-11-02 DIAGNOSIS — I10 Essential (primary) hypertension: Secondary | ICD-10-CM | POA: Diagnosis not present

## 2022-11-02 DIAGNOSIS — Z1339 Encounter for screening examination for other mental health and behavioral disorders: Secondary | ICD-10-CM | POA: Diagnosis not present

## 2023-06-08 DIAGNOSIS — Z79899 Other long term (current) drug therapy: Secondary | ICD-10-CM | POA: Diagnosis not present

## 2023-06-08 DIAGNOSIS — Z299 Encounter for prophylactic measures, unspecified: Secondary | ICD-10-CM | POA: Diagnosis not present

## 2023-06-08 DIAGNOSIS — Z1331 Encounter for screening for depression: Secondary | ICD-10-CM | POA: Diagnosis not present

## 2023-06-08 DIAGNOSIS — Z1339 Encounter for screening examination for other mental health and behavioral disorders: Secondary | ICD-10-CM | POA: Diagnosis not present

## 2023-06-08 DIAGNOSIS — Z0000 Encounter for general adult medical examination without abnormal findings: Secondary | ICD-10-CM | POA: Diagnosis not present

## 2023-06-08 DIAGNOSIS — Z125 Encounter for screening for malignant neoplasm of prostate: Secondary | ICD-10-CM | POA: Diagnosis not present

## 2023-06-08 DIAGNOSIS — R5383 Other fatigue: Secondary | ICD-10-CM | POA: Diagnosis not present

## 2023-06-08 DIAGNOSIS — Z7189 Other specified counseling: Secondary | ICD-10-CM | POA: Diagnosis not present

## 2023-06-08 DIAGNOSIS — I10 Essential (primary) hypertension: Secondary | ICD-10-CM | POA: Diagnosis not present

## 2023-06-08 DIAGNOSIS — E7800 Pure hypercholesterolemia, unspecified: Secondary | ICD-10-CM | POA: Diagnosis not present

## 2023-09-28 DIAGNOSIS — H353131 Nonexudative age-related macular degeneration, bilateral, early dry stage: Secondary | ICD-10-CM | POA: Diagnosis not present

## 2023-09-28 DIAGNOSIS — H2513 Age-related nuclear cataract, bilateral: Secondary | ICD-10-CM | POA: Diagnosis not present

## 2023-09-28 DIAGNOSIS — B880 Other acariasis: Secondary | ICD-10-CM | POA: Diagnosis not present

## 2023-12-07 DIAGNOSIS — H2513 Age-related nuclear cataract, bilateral: Secondary | ICD-10-CM | POA: Diagnosis not present

## 2023-12-07 DIAGNOSIS — H16223 Keratoconjunctivitis sicca, not specified as Sjogren's, bilateral: Secondary | ICD-10-CM | POA: Diagnosis not present

## 2023-12-07 DIAGNOSIS — H5203 Hypermetropia, bilateral: Secondary | ICD-10-CM | POA: Diagnosis not present

## 2023-12-07 DIAGNOSIS — H353211 Exudative age-related macular degeneration, right eye, with active choroidal neovascularization: Secondary | ICD-10-CM | POA: Diagnosis not present

## 2023-12-10 NOTE — Progress Notes (Signed)
[facility] - Clinic Note  12/14/2023   CHIEF COMPLAINT Patient presents for Retina Evaluation  HISTORY OF PRESENT ILLNESS: Brandon [name] is a 83 y.o. male who presents to the clinic today for:  HPI     Retina Evaluation   In both eyes.  This started years ago.  Associated Symptoms Floaters.  Negative for Flashes.  I, the attending physician,  performed the HPI with the patient and updated documentation appropriately.        Comments   Patient is here today based on a referral from [facility] for Macular Degeneration. He feels the eyes are itchy and is using AT's. He can see a freckle on the left eye.       Last edited by Rennis Chris, MD on 12/14/2023 11:31 AM.    Pt is here on the referral of Dr. Ilsa Iha for concern of exu ARMD OD, pt states he was referred to Dr. Ilsa Iha from Dr. Junie Panning at [facility], pt states he is using Refresh and Pataday drops, he states his eyes itch a lot, he states he noticed a decrease in [place] about 6 months ago   Referring physician: Pecolia Ades, MD [address] [place],  [place] [address]  HISTORICAL INFORMATION:  Selected notes from the MEDICAL RECORD NUMBER Referred by Dr. Ilsa Iha for concern of macular degeneration LEE:  Ocular Hx- PMH-   CURRENT MEDICATIONS: No current outpatient medications on file. (Ophthalmic Drugs)   No current facility-administered medications for this visit. (Ophthalmic Drugs)   Current Outpatient Medications (Other)  Medication Sig   aspirin 325 MG tablet Take 1 tablet (325 mg total) by mouth daily.   b complex vitamins capsule Take 1 capsule by mouth 2 (two) times daily.   clobetasol (OLUX) 0.05 % topical foam Apply 1 application topically 2 (two) times daily as needed (rash).    clobetasol cream (TEMOVATE) 0.05 % Apply topically 2 (two) times daily.   famotidine (PEPCID) 10 MG tablet Take 10 mg by mouth 2 (two) times daily as needed for heartburn or indigestion.    loperamide  (IMODIUM A-D) 2 MG tablet Take 1 tablet (2 mg total) by mouth daily as needed for diarrhea or loose stools.   metroNIDAZOLE (FLAGYL) 500 MG tablet Take 500 mg by mouth 3 (three) times daily.   nitroGLYCERIN (NITROSTAT) 0.4 MG SL tablet Place 0.4 mg under the tongue every 5 (five) minutes as needed for chest pain.    Omega-3 Krill Oil 450 MG CAPS Take by mouth daily. This is Omega 4  [place].   OVER THE COUNTER MEDICATION Take 10 drops by mouth daily. Liquid Zinc   OVER THE COUNTER MEDICATION Take 14 drops by mouth daily. Vitamin d3 and k2 combination.   simvastatin (ZOCOR) 20 MG tablet Take 20 mg by mouth at bedtime.    tamsulosin (FLOMAX) 0.4 MG CAPS capsule Take 1 capsule (0.4 mg total) by mouth daily.   vitamin B-12 (CYANOCOBALAMIN) 1000 MCG tablet Take 1,000 mcg by mouth daily.   No current facility-administered medications for this visit. (Other)   REVIEW OF SYSTEMS: ROS   Positive for: Gastrointestinal, Musculoskeletal, Cardiovascular, Eyes Last edited by Charlette Caffey, COT on 12/14/2023  9:57 AM.     ALLERGIES Allergies  Allergen Reactions   Oxycodone    Oxycontin [Oxycodone Hcl]     Hallucinations    PAST MEDICAL HISTORY Past Medical History:  Diagnosis Date   Chest pain, unspecified    Colon  cancer [facility]) 1999   Coronary artery disease    GERD (gastroesophageal reflux disease)    History of gout    History of herniorrhaphy    Hypercholesteremia    Hypertension    Past Surgical History:  Procedure Laterality Date   AMPUTATION OF REPLICATED TOES Right    big toe   APPENDECTOMY     BALLOON DILATION N/A 08/01/2018   Procedure: BALLOON DILATION;  Surgeon: Malissa Hippo, MD;  [facility];  Service: Endoscopy;  Laterality: N/A;   BILIARY STENT PLACEMENT N/A 06/15/2018   Procedure: BILIARY STENT PLACEMENT;  Surgeon: Malissa Hippo, MD;  [facility];  Service: Endoscopy;  Laterality: N/A;   CARDIAC CATHETERIZATION     stents x2    CHOLECYSTECTOMY N/A 06/17/2018   Procedure: ATTEMPTED LAPAROSCOPIC CHOLECYSTECTOMY,;  Surgeon: Franky Macho, MD;  Location: [facility];  Service: General;  Laterality: N/A;   CHOLECYSTECTOMY N/A 06/17/2018   Procedure: CHOLECYSTECTOMY;  Surgeon: Franky Macho, MD;  Location: [facility];  Service: General;  Laterality: N/A;   COLONOSCOPY N/A 11/21/2014   Procedure: COLONOSCOPY;  Surgeon: Malissa Hippo, MD;  [facility];  Service: Endoscopy;  Laterality: N/A;  830   CORONARY ANGIOPLASTY     ERCP N/A 06/15/2018   Procedure: ENDOSCOPIC RETROGRADE CHOLANGIOPANCREATOGRAPHY (ERCP);  Surgeon: Malissa Hippo, MD;  [facility];  Service: Endoscopy;  Laterality: N/A;   ERCP N/A 08/01/2018   Procedure: ENDOSCOPIC RETROGRADE CHOLANGIOPANCREATOGRAPHY (ERCP);  Surgeon: Malissa Hippo, MD;  [facility];  Service: Endoscopy;  Laterality: N/A;   heart stents     HERNIA REPAIR     Incisional hernia   INGUINAL HERNIA REPAIR Right 09/02/2016   Procedure: HERNIA REPAIR INGUINAL ADULT WITH MESH;  Surgeon: Franky Macho, MD;  Location: [facility];  Service: General;  Laterality: Right;   Left foot surgery Left    lawn mower accidnet   PARTIAL COLECTOMY     REMOVAL OF STONES N/A 08/01/2018   Procedure: REMOVAL OF STONES;  Surgeon: Malissa Hippo, MD;  [facility];  Service: Endoscopy;  Laterality: N/A;   Right great toe surgery     SPHINCTEROTOMY N/A 06/15/2018   Procedure: SPHINCTEROTOMY;  Surgeon: Malissa Hippo, MD;  [facility];  Service: Endoscopy;  Laterality: N/A;   SPHINCTEROTOMY N/A 08/01/2018   Procedure: SPHINCTEROTOMY;  Surgeon: Malissa Hippo, MD;  [facility];  Service: Endoscopy;  Laterality: N/A;   SPYGLASS CHOLANGIOSCOPY N/A 08/01/2018   Procedure: SPYGLASS CHOLANGIOSCOPY;  Surgeon: Malissa Hippo, MD;  [facility];  Service: Endoscopy;  Laterality: N/A;   STENT REMOVAL N/A 08/01/2018   Procedure: BILIARY STENT REMOVAL;   Surgeon: Malissa Hippo, MD;  [facility];  Service: Endoscopy;  Laterality: N/A;   TONSILLECTOMY     FAMILY HISTORY Family History  Problem Relation Age of Onset   Cerebral aneurysm Mother    Alzheimer's disease Father    SOCIAL HISTORY Social History   Tobacco Use   Smoking status: Former    Current packs/day: 0.00    Average packs/day: 1 pack/day for 30.0 years (30.0 ttl pk-yrs)    Types: Cigarettes    Start date: 08/29/1979    Quit date: 08/28/2009    Years since quitting: 14.3   Smokeless tobacco: Current    Types: Snuff  Vaping Use   Vaping status: Never Used  Substance Use Topics   Alcohol use:  Yes    Comment: socially   Drug use: No       OPHTHALMIC EXAM:  Base Eye Exam     Visual Acuity (Snellen - Linear)       Right Left   Dist [place] 20/70 -1 20/20   Dist ph [place] NI          Tonometry (Tonopen, 8:52 AM)       Right Left   Pressure 12 12         Pupils       Dark Light Shape React APD   Right 4 3 Round Slow None   Left 4 3 Round Slow None         Visual Fields       Left Right    Full Full         Extraocular Movement       Right Left    Full, Ortho Full, Ortho         Neuro/Psych     Oriented x3: Yes         Dilation     Both eyes: 1.0% Mydriacyl, 2.5% Phenylephrine @ 8:49 AM           Slit Lamp and Fundus Exam     Slit Lamp Exam       Right Left   Lids/Lashes Dermatochalasis - upper lid Dermatochalasis - upper lid   Conjunctiva/Sclera White and quiet White and quiet   Cornea Mild tear film debris, trace PEE Mild tear film debris, 1+ Punctate epithelial erosions   Anterior Chamber deep, clear, narrow temporal angle deep, clear, narrow temporal angle   Iris Round and dilated Round and dilated   Lens 2-3+ Nuclear sclerosis with mild brunescence, 2-3+ Cortical cataract 2-3+ Nuclear sclerosis, 3+ Cortical cataract   Anterior Vitreous mild syneresis mild syneresis, Posterior vitreous detachment          Fundus Exam       Right Left   Disc Pink and Sharp Pink and Sharp   C/D Ratio 0.2 0.3   Macula central CNV with edema, punctate heme inferiorly Flat, Blunted foveal reflex, RPE mottling, No heme or edema   Vessels mild tortuosity mild tortuosity   Periphery Attached, reticular degeneration, No heme Attached, reticular degeneration, No heme           IMAGING AND PROCEDURES  Imaging and Procedures for 12/14/2023  OCT, Retina - OU - Both Eyes       Right Eye Quality was good. Central Foveal Thickness: 239. Progression has no prior data. Findings include no IRF, abnormal foveal contour, subretinal hyper-reflective material, subretinal fluid (Central CNV with edema).   Left Eye Quality was good. Central Foveal Thickness: 282. Progression has no prior data. Findings include normal foveal contour, no IRF, no SRF (Rare drusen).   Notes *Images captured and stored on drive  Diagnosis / Impression:  OD: Central CNV with edema -- exudative ARMD OS: rare drusen -- nonexudative ARMD  Clinical management:  See below  Abbreviations: NFP - Normal foveal profile. CME - cystoid macular edema. PED - pigment epithelial detachment. IRF - intraretinal fluid. SRF - subretinal fluid. EZ - ellipsoid zone. ERM - epiretinal membrane. ORA - outer retinal atrophy. ORT - outer retinal tubulation. SRHM - subretinal hyper-reflective material. IRHM - intraretinal hyper-reflective material      Intravitreal Injection, Pharmacologic Agent - OD - Right Eye       Time Out 12/14/2023. 10:04 AM. Confirmed correct patient, procedure,  site, and patient consented.   Anesthesia Topical anesthesia was used. Anesthetic medications included Lidocaine 2%, Proparacaine 0.5%.   Procedure Preparation included 5% betadine to ocular surface, eyelid speculum. A supplied needle was used.   Injection: 1.25 mg Bevacizumab 1.25mg /0.31ml   Route: Intravitreal, Site: Right Eye   NDC: [id number], Lot: [id number],  Expiration date: 01/14/2024   Post-op Post injection exam found visual acuity of at least counting fingers. The patient tolerated the procedure well. There were no complications. The patient received written and verbal post procedure care education.           ASSESSMENT/PLAN:   ICD-10-CM   1. 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 Essential hypertension  I10     4. Hypertensive retinopathy of both eyes  H35.033     5. Combined forms of age-related cataract of both eyes  H25.813      1. Exudative age related macular degeneration, OD  - pt reports ~6 mo history of decreased vision OD  - The incidence pathology and anatomy of wet AMD discussed   - discussed treatment options including observation vs intravitreal anti-VEGF agents such as Avastin, Lucentis, Eylea, Vabysmo  - Risks of endophthalmitis and vascular occlusive events and atrophic changes discussed with patient  - OCT OD shows central CNV with edema  - BCVA OD 20/70  - recommend IVA OD #1 today, 01.28.25  - pt wishes to be treated with IVA  - RBA of procedure discussed, questions answered - IVA informed consent obtained and signed, 01.28.25 - see procedure note  - f/u in 4 wks, DFE, OCT  2. Age related macular degeneration, non-exudative OS  - The incidence, anatomy, and pathology of dry AMD, risk of progression, and the AREDS and AREDS 2 study including smoking risks discussed with patient.  - Recommend amsler grid monitoring  - monitor  3,4. Hypertensive retinopathy OU - discussed importance of tight BP control - monitor  5. Mixed Cataract OU - The symptoms of cataract, surgical options, and treatments and risks  were discussed with patient. - discussed diagnosis and progression - under the expert management of Dr. Christa See - clear from a retina standpoint to proceed with cataract surgery when pt and surgeon are ready   Ophthalmic Meds Ordered this visit:  Meds ordered this encounter  Medications   Bevacizumab (AVASTIN) SOLN 1.25 mg     Return in about 4 weeks (around 01/11/2024) for f/u exu ARMD OD, DFE, OCT.  There are no Patient Instructions on file for this visit.  Explained the diagnoses, plan, and follow up with the patient and they expressed understanding.  Patient expressed understanding of the importance of proper follow up care.   This document serves as a record of services personally performed by Karie Chimera, MD, PhD. It was created on their behalf by Charlette Caffey, COT an ophthalmic technician. The creation of this record is the provider's dictation and/or activities during the visit.    Electronically signed by:  Charlette Caffey, COT  12/14/23 11:32 AM  This document serves as a record of services personally performed by Karie Chimera, MD, PhD. It was created on their behalf by  Glee Arvin. Manson Passey, OA an ophthalmic technician. The creation of this record is the provider's dictation and/or activities during the visit.    Electronically signed by: Glee Arvin. Manson Passey, OA 12/14/23 11:32 AM  Karie Chimera, M.D., Ph.D. Diseases & Surgery of the Retina and Vitreous [facility] 12/14/2023  I have reviewed the above documentation for accuracy and completeness, and I agree with the above. Karie Chimera, M.D., Ph.D. 12/14/23 11:35 AM   Abbreviations: M myopia (nearsighted); A astigmatism; H hyperopia (farsighted); P presbyopia; Mrx spectacle prescription;  CTL contact lenses; OD right eye; OS left eye; OU both eyes  XT exotropia; ET esotropia; PEK punctate epithelial keratitis; PEE punctate epithelial erosions; DES dry eye syndrome; MGD meibomian gland  dysfunction; ATs artificial tears; PFAT's preservative free artificial tears; NSC nuclear sclerotic cataract; PSC posterior subcapsular cataract; ERM epi-retinal membrane; PVD posterior vitreous detachment; RD retinal detachment; DM diabetes mellitus; DR diabetic retinopathy; NPDR non-proliferative diabetic retinopathy; PDR proliferative diabetic retinopathy; CSME clinically significant macular edema; DME diabetic macular edema; dbh dot blot hemorrhages; CWS cotton wool spot; POAG primary open angle glaucoma; C/D cup-to-disc ratio; HVF humphrey visual field; GVF goldmann visual field; OCT optical coherence tomography; IOP intraocular pressure; BRVO Branch retinal vein occlusion; CRVO central retinal vein occlusion; CRAO central retinal artery occlusion; BRAO branch retinal artery occlusion; RT retinal tear; SB scleral buckle; PPV pars plana vitrectomy; VH Vitreous hemorrhage; PRP panretinal laser photocoagulation; IVK intravitreal kenalog; VMT vitreomacular traction; MH Macular hole;  NVD neovascularization of the disc; NVE neovascularization elsewhere; AREDS age related eye disease study; ARMD age related macular degeneration; POAG primary open angle glaucoma; EBMD epithelial/anterior basement membrane dystrophy; ACIOL anterior chamber intraocular lens; IOL intraocular lens; PCIOL posterior chamber intraocular lens; Phaco/IOL phacoemulsification with intraocular lens placement; PRK photorefractive keratectomy; LASIK laser assisted in situ keratomileusis; HTN hypertension; DM diabetes mellitus; COPD chronic obstructive pulmonary disease

## 2023-12-14 ENCOUNTER — Ambulatory Visit (INDEPENDENT_AMBULATORY_CARE_PROVIDER_SITE_OTHER): Payer: Medicare HMO | Admitting: Ophthalmology

## 2023-12-14 ENCOUNTER — Encounter (INDEPENDENT_AMBULATORY_CARE_PROVIDER_SITE_OTHER): Payer: Self-pay | Admitting: Ophthalmology

## 2023-12-14 DIAGNOSIS — H25813 Combined forms of age-related cataract, bilateral: Secondary | ICD-10-CM

## 2023-12-14 DIAGNOSIS — H3581 Retinal edema: Secondary | ICD-10-CM

## 2023-12-14 DIAGNOSIS — H353211 Exudative age-related macular degeneration, right eye, with active choroidal neovascularization: Secondary | ICD-10-CM | POA: Diagnosis not present

## 2023-12-14 DIAGNOSIS — H353121 Nonexudative age-related macular degeneration, left eye, early dry stage: Secondary | ICD-10-CM

## 2023-12-14 DIAGNOSIS — H35033 Hypertensive retinopathy, bilateral: Secondary | ICD-10-CM

## 2023-12-14 DIAGNOSIS — I10 Essential (primary) hypertension: Secondary | ICD-10-CM | POA: Diagnosis not present

## 2023-12-14 MED ORDER — BEVACIZUMAB CHEMO INJECTION 1.25MG/0.05ML SYRINGE FOR KALEIDOSCOPE
1.2500 mg | INTRAVITREAL | Status: AC | PRN
  Administered 2023-12-14: 1.25 mg via INTRAVITREAL

## 2023-12-29 NOTE — Progress Notes (Shared)
[facility] - Clinic Note  01/11/2024   CHIEF COMPLAINT Patient presents for No chief complaint on file.  HISTORY OF PRESENT ILLNESS: Brandon [name] is a 83 y.o. male who presents to the clinic today for:   Pt is here on the referral of Dr. Ilsa Iha for concern of exu ARMD OD, pt states he was referred to Dr. Ilsa Iha from Dr. Junie Panning at [facility], pt states he is using Refresh and Pataday drops, he states his eyes itch a lot, he states he noticed a decrease in [place] about 6 months ago   Referring physician: Kirstie Peri, MD [address] [place],  [place] [address]  HISTORICAL INFORMATION:  Selected notes from the MEDICAL RECORD NUMBER Referred by Dr. Ilsa Iha for concern of macular degeneration LEE:  Ocular Hx- PMH-   CURRENT MEDICATIONS: No current outpatient medications on file. (Ophthalmic Drugs)   No current facility-administered medications for this visit. (Ophthalmic Drugs)   Current Outpatient Medications (Other)  Medication Sig   aspirin 325 MG tablet Take 1 tablet (325 mg total) by mouth daily.   b complex vitamins capsule Take 1 capsule by mouth 2 (two) times daily.   clobetasol (OLUX) 0.05 % topical foam Apply 1 application topically 2 (two) times daily as needed (rash).    clobetasol cream (TEMOVATE) 0.05 % Apply topically 2 (two) times daily.   famotidine (PEPCID) 10 MG tablet Take 10 mg by mouth 2 (two) times daily as needed for heartburn or indigestion.    loperamide (IMODIUM A-D) 2 MG tablet Take 1 tablet (2 mg total) by mouth daily as needed for diarrhea or loose stools.   metroNIDAZOLE (FLAGYL) 500 MG tablet Take 500 mg by mouth 3 (three) times daily.   nitroGLYCERIN (NITROSTAT) 0.4 MG SL tablet Place 0.4 mg under the tongue every 5 (five) minutes as needed for chest pain.    Omega-3 Krill Oil 450 MG CAPS Take by mouth daily. This is Omega 4  [place].   OVER THE COUNTER MEDICATION Take 10 drops by mouth daily. Liquid Zinc   OVER THE COUNTER MEDICATION  Take 14 drops by mouth daily. Vitamin d3 and k2 combination.   simvastatin (ZOCOR) 20 MG tablet Take 20 mg by mouth at bedtime.    tamsulosin (FLOMAX) 0.4 MG CAPS capsule Take 1 capsule (0.4 mg total) by mouth daily.   vitamin B-12 (CYANOCOBALAMIN) 1000 MCG tablet Take 1,000 mcg by mouth daily.   No current facility-administered medications for this visit. (Other)   REVIEW OF SYSTEMS:   ALLERGIES Allergies  Allergen Reactions   Oxycodone    Oxycontin [Oxycodone Hcl]     Hallucinations    PAST MEDICAL HISTORY Past Medical History:  Diagnosis Date   Chest pain, unspecified    Colon cancer (HCC) 1999   Coronary artery disease    GERD (gastroesophageal reflux disease)    History of gout    History of herniorrhaphy    Hypercholesteremia    Hypertension    Past Surgical History:  Procedure Laterality Date   AMPUTATION OF REPLICATED TOES Right    big toe   APPENDECTOMY     BALLOON DILATION N/A 08/01/2018   Procedure: BALLOON DILATION;  Surgeon: Malissa Hippo, MD;  [facility];  Service: Endoscopy;  Laterality: N/A;   BILIARY STENT PLACEMENT N/A 06/15/2018   Procedure: BILIARY STENT PLACEMENT;  Surgeon: Malissa Hippo, MD;  [facility];  Service: Endoscopy;  Laterality: N/A;  CARDIAC CATHETERIZATION     stents x2   CHOLECYSTECTOMY N/A 06/17/2018   Procedure: ATTEMPTED LAPAROSCOPIC CHOLECYSTECTOMY,;  Surgeon: Franky Macho, MD;  Location: [facility];  Service: General;  Laterality: N/A;   CHOLECYSTECTOMY N/A 06/17/2018   Procedure: CHOLECYSTECTOMY;  Surgeon: Franky Macho, MD;  Location: [facility];  Service: General;  Laterality: N/A;   COLONOSCOPY N/A 11/21/2014   Procedure: COLONOSCOPY;  Surgeon: Malissa Hippo, MD;  [facility];  Service: Endoscopy;  Laterality: N/A;  830   CORONARY ANGIOPLASTY     ERCP N/A 06/15/2018   Procedure: ENDOSCOPIC RETROGRADE CHOLANGIOPANCREATOGRAPHY (ERCP);  Surgeon: Malissa Hippo, MD;  [facility];   Service: Endoscopy;  Laterality: N/A;   ERCP N/A 08/01/2018   Procedure: ENDOSCOPIC RETROGRADE CHOLANGIOPANCREATOGRAPHY (ERCP);  Surgeon: Malissa Hippo, MD;  [facility];  Service: Endoscopy;  Laterality: N/A;   heart stents     HERNIA REPAIR     Incisional hernia   INGUINAL HERNIA REPAIR Right 09/02/2016   Procedure: HERNIA REPAIR INGUINAL ADULT WITH MESH;  Surgeon: Franky Macho, MD;  Location: [facility];  Service: General;  Laterality: Right;   Left foot surgery Left    lawn mower accidnet   PARTIAL COLECTOMY     REMOVAL OF STONES N/A 08/01/2018   Procedure: REMOVAL OF STONES;  Surgeon: Malissa Hippo, MD;  [facility];  Service: Endoscopy;  Laterality: N/A;   Right great toe surgery     SPHINCTEROTOMY N/A 06/15/2018   Procedure: SPHINCTEROTOMY;  Surgeon: Malissa Hippo, MD;  [facility];  Service: Endoscopy;  Laterality: N/A;   SPHINCTEROTOMY N/A 08/01/2018   Procedure: SPHINCTEROTOMY;  Surgeon: Malissa Hippo, MD;  [facility];  Service: Endoscopy;  Laterality: N/A;   SPYGLASS CHOLANGIOSCOPY N/A 08/01/2018   Procedure: SPYGLASS CHOLANGIOSCOPY;  Surgeon: Malissa Hippo, MD;  [facility];  Service: Endoscopy;  Laterality: N/A;   STENT REMOVAL N/A 08/01/2018   Procedure: BILIARY STENT REMOVAL;  Surgeon: Malissa Hippo, MD;  [facility];  Service: Endoscopy;  Laterality: N/A;   TONSILLECTOMY     FAMILY HISTORY Family History  Problem Relation Age of Onset   Cerebral aneurysm Mother    Alzheimer's disease Father    SOCIAL HISTORY Social History   Tobacco Use   Smoking status: Former    Current packs/day: 0.00    Average packs/day: 1 pack/day for 30.0 years (30.0 ttl pk-yrs)    Types: Cigarettes    Start date: 08/29/1979    Quit date: 08/28/2009    Years since quitting: 14.3   Smokeless tobacco: Current    Types: Snuff  Vaping Use   Vaping status: Never Used  Substance Use Topics   Alcohol use: Yes     Comment: socially   Drug use: No       OPHTHALMIC EXAM:  Not recorded    IMAGING AND PROCEDURES  Imaging and Procedures for 01/11/2024         ASSESSMENT/PLAN:   ICD-10-CM   1. Exudative age-related macular degeneration of right eye with active choroidal neovascularization (HCC)  H35.3211     2. 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 Essential hypertension  I10     4. Hypertensive retinopathy of both eyes  H35.033     5. Combined forms of age-related cataract of both eyes  H25.813  1. Exudative age related macular degeneration, OD  - pt reports ~6 mo history of decreased vision OD  - The incidence pathology and anatomy of wet AMD discussed   -s/p IVA OD #1 (01.28.25)  - discussed treatment options including observation vs intravitreal anti-VEGF agents such as Avastin, Lucentis, Eylea, Vabysmo  - Risks of endophthalmitis and vascular occlusive events and atrophic changes discussed with patient  - OCT OD shows central CNV with edema  - BCVA OD 20/70  - recommend IVA OD #2 today, 02.25.25  - pt wishes to be treated with IVA  - RBA of procedure discussed, questions answered - IVA informed consent obtained and signed, 01.28.25 - see procedure note  - f/u in 4 wks, DFE, OCT  2. Age related macular degeneration, non-exudative OS  - The incidence, anatomy, and pathology of dry AMD, risk of progression, and the AREDS and AREDS 2 study including smoking risks discussed with patient.  - Recommend amsler grid monitoring  - monitor  3,4. Hypertensive retinopathy OU - discussed importance of tight BP control - monitor  5. Mixed Cataract OU - The symptoms of cataract, surgical options, and treatments and risks were discussed with patient. - discussed diagnosis and progression - under the expert management of Dr. Christa See - clear from a retina standpoint to proceed with cataract surgery when pt and surgeon are  ready   Ophthalmic Meds Ordered this visit:  No orders of the defined types were placed in this encounter.    No follow-ups on file.  There are no Patient Instructions on file for this visit.  Explained the diagnoses, plan, and follow up with the patient and they expressed understanding.  Patient expressed understanding of the importance of proper follow up care.   This document serves as a record of services personally performed by Karie Chimera, MD, PhD. It was created on their behalf by Charlette Caffey, COT an ophthalmic technician. The creation of this record is the provider's dictation and/or activities during the visit.    Electronically signed by:  Charlette Caffey, COT  12/29/23 10:13 AM   Karie Chimera, M.D., Ph.D. Diseases & Surgery of the Retina and Vitreous [facility] 01/11/2024   Abbreviations: M myopia (nearsighted); A astigmatism; H hyperopia (farsighted); P presbyopia; Mrx spectacle prescription;  CTL contact lenses; OD right eye; OS left eye; OU both eyes  XT exotropia; ET esotropia; PEK punctate epithelial keratitis; PEE punctate epithelial erosions; DES dry eye syndrome; MGD meibomian gland dysfunction; ATs artificial tears; PFAT's preservative free artificial tears; NSC nuclear sclerotic cataract; PSC posterior subcapsular cataract; ERM epi-retinal membrane; PVD posterior vitreous detachment; RD retinal detachment; DM diabetes mellitus; DR diabetic retinopathy; NPDR non-proliferative diabetic retinopathy; PDR proliferative diabetic retinopathy; CSME clinically significant macular edema; DME diabetic macular edema; dbh dot blot hemorrhages; CWS cotton wool spot; POAG primary open angle glaucoma; C/D cup-to-disc ratio; HVF humphrey visual field; GVF goldmann visual field; OCT optical coherence tomography; IOP intraocular pressure; BRVO Branch retinal vein occlusion; CRVO central retinal vein occlusion; CRAO central retinal artery occlusion;  BRAO branch retinal artery occlusion; RT retinal tear; SB scleral buckle; PPV pars plana vitrectomy; VH Vitreous hemorrhage; PRP panretinal laser photocoagulation; IVK intravitreal kenalog; VMT vitreomacular traction; MH Macular hole;  NVD neovascularization of the disc; NVE neovascularization elsewhere; AREDS age related eye disease study; ARMD age related macular degeneration; POAG primary open angle glaucoma; EBMD epithelial/anterior basement membrane dystrophy; ACIOL anterior chamber intraocular lens; IOL intraocular lens; PCIOL posterior  chamber intraocular lens; Phaco/IOL phacoemulsification with intraocular lens placement; PRK photorefractive keratectomy; LASIK laser assisted in situ keratomileusis; HTN hypertension; DM diabetes mellitus; COPD chronic obstructive pulmonary disease

## 2024-01-11 ENCOUNTER — Encounter (INDEPENDENT_AMBULATORY_CARE_PROVIDER_SITE_OTHER): Payer: Medicare HMO | Admitting: Ophthalmology

## 2024-01-11 DIAGNOSIS — H25813 Combined forms of age-related cataract, bilateral: Secondary | ICD-10-CM

## 2024-01-11 DIAGNOSIS — I10 Essential (primary) hypertension: Secondary | ICD-10-CM

## 2024-01-11 DIAGNOSIS — H353211 Exudative age-related macular degeneration, right eye, with active choroidal neovascularization: Secondary | ICD-10-CM

## 2024-01-11 DIAGNOSIS — H35033 Hypertensive retinopathy, bilateral: Secondary | ICD-10-CM

## 2024-01-11 DIAGNOSIS — R197 Diarrhea, unspecified: Secondary | ICD-10-CM | POA: Diagnosis not present

## 2024-01-11 DIAGNOSIS — H353121 Nonexudative age-related macular degeneration, left eye, early dry stage: Secondary | ICD-10-CM

## 2024-01-11 NOTE — Progress Notes (Shared)
[facility] - Clinic Note  01/25/2024   CHIEF COMPLAINT Patient presents for No chief complaint on file.  HISTORY OF PRESENT ILLNESS: Brandon [name] is a 83 y.o. male who presents to the clinic today for:   Pt is here on the referral of Dr. Ilsa Iha for concern of exu ARMD OD, pt states he was referred to Dr. Ilsa Iha from Dr. Junie Panning at [facility], pt states he is using Refresh and Pataday drops, he states his eyes itch a lot, he states he noticed a decrease in [place] about 6 months ago   Referring physician: Kirstie Peri, MD [address] [place],  [place] [address]  HISTORICAL INFORMATION:  Selected notes from the MEDICAL RECORD NUMBER Referred by Dr. Ilsa Iha for concern of macular degeneration LEE:  Ocular Hx- PMH-   CURRENT MEDICATIONS: No current outpatient medications on file. (Ophthalmic Drugs)   No current facility-administered medications for this visit. (Ophthalmic Drugs)   Current Outpatient Medications (Other)  Medication Sig   aspirin 325 MG tablet Take 1 tablet (325 mg total) by mouth daily.   b complex vitamins capsule Take 1 capsule by mouth 2 (two) times daily.   clobetasol (OLUX) 0.05 % topical foam Apply 1 application topically 2 (two) times daily as needed (rash).    clobetasol cream (TEMOVATE) 0.05 % Apply topically 2 (two) times daily.   famotidine (PEPCID) 10 MG tablet Take 10 mg by mouth 2 (two) times daily as needed for heartburn or indigestion.    loperamide (IMODIUM A-D) 2 MG tablet Take 1 tablet (2 mg total) by mouth daily as needed for diarrhea or loose stools.   metroNIDAZOLE (FLAGYL) 500 MG tablet Take 500 mg by mouth 3 (three) times daily.   nitroGLYCERIN (NITROSTAT) 0.4 MG SL tablet Place 0.4 mg under the tongue every 5 (five) minutes as needed for chest pain.    Omega-3 Krill Oil 450 MG CAPS Take by mouth daily. This is Omega 4  [place].   OVER THE COUNTER MEDICATION Take 10 drops by mouth daily. Liquid Zinc   OVER THE COUNTER MEDICATION  Take 14 drops by mouth daily. Vitamin d3 and k2 combination.   simvastatin (ZOCOR) 20 MG tablet Take 20 mg by mouth at bedtime.    tamsulosin (FLOMAX) 0.4 MG CAPS capsule Take 1 capsule (0.4 mg total) by mouth daily.   vitamin B-12 (CYANOCOBALAMIN) 1000 MCG tablet Take 1,000 mcg by mouth daily.   No current facility-administered medications for this visit. (Other)   REVIEW OF SYSTEMS:   ALLERGIES Allergies  Allergen Reactions   Oxycodone    Oxycontin [Oxycodone Hcl]     Hallucinations    PAST MEDICAL HISTORY Past Medical History:  Diagnosis Date   Chest pain, unspecified    Colon cancer (HCC) 1999   Coronary artery disease    GERD (gastroesophageal reflux disease)    History of gout    History of herniorrhaphy    Hypercholesteremia    Hypertension    Past Surgical History:  Procedure Laterality Date   AMPUTATION OF REPLICATED TOES Right    big toe   APPENDECTOMY     BALLOON DILATION N/A 08/01/2018   Procedure: BALLOON DILATION;  Surgeon: Malissa Hippo, MD;  [facility];  Service: Endoscopy;  Laterality: N/A;   BILIARY STENT PLACEMENT N/A 06/15/2018   Procedure: BILIARY STENT PLACEMENT;  Surgeon: Malissa Hippo, MD;  [facility];  Service: Endoscopy;  Laterality: N/A;  CARDIAC CATHETERIZATION     stents x2   CHOLECYSTECTOMY N/A 06/17/2018   Procedure: ATTEMPTED LAPAROSCOPIC CHOLECYSTECTOMY,;  Surgeon: Franky Macho, MD;  [facility]: [facility];  Service: General;  Laterality: N/A;   CHOLECYSTECTOMY N/A 06/17/2018   Procedure: CHOLECYSTECTOMY;  Surgeon: Franky Macho, MD;  Location: [facility];  Service: General;  Laterality: N/A;   COLONOSCOPY N/A 11/21/2014   Procedure: COLONOSCOPY;  Surgeon: Malissa Hippo, MD;  [facility];  Service: Endoscopy;  Laterality: N/A;  830   CORONARY ANGIOPLASTY     ERCP N/A 06/15/2018   Procedure: ENDOSCOPIC RETROGRADE CHOLANGIOPANCREATOGRAPHY (ERCP);  Surgeon: Malissa Hippo, MD;  [facility];   Service: Endoscopy;  Laterality: N/A;   ERCP N/A 08/01/2018   Procedure: ENDOSCOPIC RETROGRADE CHOLANGIOPANCREATOGRAPHY (ERCP);  Surgeon: Malissa Hippo, MD;  [facility];  Service: Endoscopy;  Laterality: N/A;   heart stents     HERNIA REPAIR     Incisional hernia   INGUINAL HERNIA REPAIR Right 09/02/2016   Procedure: HERNIA REPAIR INGUINAL ADULT WITH MESH;  Surgeon: Franky Macho, MD;  [facility]: [facility];  Service: General;  Laterality: Right;   Left foot surgery Left    lawn mower accidnet   PARTIAL COLECTOMY     REMOVAL OF STONES N/A 08/01/2018   Procedure: REMOVAL OF STONES;  Surgeon: Malissa Hippo, MD;  [facility];  Service: Endoscopy;  Laterality: N/A;   Right great toe surgery     SPHINCTEROTOMY N/A 06/15/2018   Procedure: SPHINCTEROTOMY;  Surgeon: Malissa Hippo, MD;  [facility];  Service: Endoscopy;  Laterality: N/A;   SPHINCTEROTOMY N/A 08/01/2018   Procedure: SPHINCTEROTOMY;  Surgeon: Malissa Hippo, MD;  [facility];  Service: Endoscopy;  Laterality: N/A;   SPYGLASS CHOLANGIOSCOPY N/A 08/01/2018   Procedure: SPYGLASS CHOLANGIOSCOPY;  Surgeon: Malissa Hippo, MD;  [facility];  Service: Endoscopy;  Laterality: N/A;   STENT REMOVAL N/A 08/01/2018   Procedure: BILIARY STENT REMOVAL;  Surgeon: Malissa Hippo, MD;  [facility];  Service: Endoscopy;  Laterality: N/A;   TONSILLECTOMY     FAMILY HISTORY Family History  Problem Relation Age of Onset   Cerebral aneurysm Mother    Alzheimer's disease Father    SOCIAL HISTORY Social History   Tobacco Use   Smoking status: Former    Current packs/day: 0.00    Average packs/day: 1 pack/day for 30.0 years (30.0 ttl pk-yrs)    Types: Cigarettes    Start date: 08/29/1979    Quit date: 08/28/2009    Years since quitting: 14.3   Smokeless tobacco: Current    Types: Snuff  Vaping Use   Vaping status: Never Used  Substance Use Topics   Alcohol use: Yes     Comment: socially   Drug use: No       OPHTHALMIC EXAM:  Not recorded    IMAGING AND PROCEDURES  Imaging and Procedures for 01/25/2024         ASSESSMENT/PLAN:   ICD-10-CM   1. Exudative age-related macular degeneration of right eye with active choroidal neovascularization (HCC)  H35.3211     2. 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 Essential hypertension  I10     4. Hypertensive retinopathy of both eyes  H35.033     5. Combined forms of age-related cataract of both eyes  H25.813  1. Exudative age related macular degeneration, OD  - pt reports ~6 mo history of decreased vision OD  - The incidence pathology and anatomy of wet AMD discussed   -s/p IVA OD #1 (01.28.25)  - discussed treatment options including observation vs intravitreal anti-VEGF agents such as Avastin, Lucentis, Eylea, Vabysmo  - Risks of endophthalmitis and vascular occlusive events and atrophic changes discussed with patient  - OCT OD shows central CNV with edema  - BCVA OD 20/70  - recommend IVA OD #2 today, 03.11.25  - pt wishes to be treated with IVA  - RBA of procedure discussed, questions answered - IVA informed consent obtained and signed, 01.28.25 - see procedure note  - f/u in 4 wks, DFE, OCT  2. Age related macular degeneration, non-exudative OS  - The incidence, anatomy, and pathology of dry AMD, risk of progression, and the AREDS and AREDS 2 study including smoking risks discussed with patient.  - Recommend amsler grid monitoring  - monitor  3,4. Hypertensive retinopathy OU - discussed importance of tight BP control - monitor  5. Mixed Cataract OU - The symptoms of cataract, surgical options, and treatments and risks were discussed with patient. - discussed diagnosis and progression - under the expert management of Dr. Christa See - clear from a retina standpoint to proceed with cataract surgery when pt and surgeon are  ready   Ophthalmic Meds Ordered this visit:  No orders of the defined types were placed in this encounter.    No follow-ups on file.  There are no Patient Instructions on file for this visit.  Explained the diagnoses, plan, and follow up with the patient and they expressed understanding.  Patient expressed understanding of the importance of proper follow up care.   This document serves as a record of services personally performed by Karie Chimera, MD, PhD. It was created on their behalf by Charlette Caffey, COT an ophthalmic technician. The creation of this record is the provider's dictation and/or activities during the visit.    Electronically signed by:  Charlette Caffey, COT  01/11/24 8:11 AM   Karie Chimera, M.D., Ph.D. Diseases & Surgery of the Retina and Vitreous [facility] 01/25/2024   Abbreviations: M myopia (nearsighted); A astigmatism; H hyperopia (farsighted); P presbyopia; Mrx spectacle prescription;  CTL contact lenses; OD right eye; OS left eye; OU both eyes  XT exotropia; ET esotropia; PEK punctate epithelial keratitis; PEE punctate epithelial erosions; DES dry eye syndrome; MGD meibomian gland dysfunction; ATs artificial tears; PFAT's preservative free artificial tears; NSC nuclear sclerotic cataract; PSC posterior subcapsular cataract; ERM epi-retinal membrane; PVD posterior vitreous detachment; RD retinal detachment; DM diabetes mellitus; DR diabetic retinopathy; NPDR non-proliferative diabetic retinopathy; PDR proliferative diabetic retinopathy; CSME clinically significant macular edema; DME diabetic macular edema; dbh dot blot hemorrhages; CWS cotton wool spot; POAG primary open angle glaucoma; C/D cup-to-disc ratio; HVF humphrey visual field; GVF goldmann visual field; OCT optical coherence tomography; IOP intraocular pressure; BRVO Branch retinal vein occlusion; CRVO central retinal vein occlusion; CRAO central retinal artery occlusion;  BRAO branch retinal artery occlusion; RT retinal tear; SB scleral buckle; PPV pars plana vitrectomy; VH Vitreous hemorrhage; PRP panretinal laser photocoagulation; IVK intravitreal kenalog; VMT vitreomacular traction; MH Macular hole;  NVD neovascularization of the disc; NVE neovascularization elsewhere; AREDS age related eye disease study; ARMD age related macular degeneration; POAG primary open angle glaucoma; EBMD epithelial/anterior basement membrane dystrophy; ACIOL anterior chamber intraocular lens; IOL intraocular lens; PCIOL posterior  chamber intraocular lens; Phaco/IOL phacoemulsification with intraocular lens placement; PRK photorefractive keratectomy; LASIK laser assisted in situ keratomileusis; HTN hypertension; DM diabetes mellitus; COPD chronic obstructive pulmonary disease

## 2024-01-25 ENCOUNTER — Encounter (INDEPENDENT_AMBULATORY_CARE_PROVIDER_SITE_OTHER): Payer: Medicare HMO | Admitting: Ophthalmology

## 2024-01-25 DIAGNOSIS — H35033 Hypertensive retinopathy, bilateral: Secondary | ICD-10-CM

## 2024-01-25 DIAGNOSIS — H353211 Exudative age-related macular degeneration, right eye, with active choroidal neovascularization: Secondary | ICD-10-CM

## 2024-01-25 DIAGNOSIS — H25813 Combined forms of age-related cataract, bilateral: Secondary | ICD-10-CM

## 2024-01-25 DIAGNOSIS — H353121 Nonexudative age-related macular degeneration, left eye, early dry stage: Secondary | ICD-10-CM

## 2024-01-25 DIAGNOSIS — I10 Essential (primary) hypertension: Secondary | ICD-10-CM

## 2024-03-28 DIAGNOSIS — I700 Atherosclerosis of aorta: Secondary | ICD-10-CM | POA: Diagnosis not present

## 2024-03-28 DIAGNOSIS — R5383 Other fatigue: Secondary | ICD-10-CM | POA: Diagnosis not present

## 2024-04-04 ENCOUNTER — Encounter (INDEPENDENT_AMBULATORY_CARE_PROVIDER_SITE_OTHER): Payer: Self-pay | Admitting: Ophthalmology

## 2024-04-04 ENCOUNTER — Ambulatory Visit (INDEPENDENT_AMBULATORY_CARE_PROVIDER_SITE_OTHER): Admitting: Ophthalmology

## 2024-04-04 DIAGNOSIS — H353121 Nonexudative age-related macular degeneration, left eye, early dry stage: Secondary | ICD-10-CM | POA: Diagnosis not present

## 2024-04-04 DIAGNOSIS — I10 Essential (primary) hypertension: Secondary | ICD-10-CM

## 2024-04-04 DIAGNOSIS — H25813 Combined forms of age-related cataract, bilateral: Secondary | ICD-10-CM

## 2024-04-04 DIAGNOSIS — H35033 Hypertensive retinopathy, bilateral: Secondary | ICD-10-CM

## 2024-04-04 DIAGNOSIS — H353211 Exudative age-related macular degeneration, right eye, with active choroidal neovascularization: Secondary | ICD-10-CM | POA: Diagnosis not present

## 2024-04-04 MED ORDER — BEVACIZUMAB CHEMO INJECTION 1.25MG/0.05ML SYRINGE FOR KALEIDOSCOPE
1.2500 mg | INTRAVITREAL | Status: AC | PRN
  Administered 2024-04-04: 1.25 mg via INTRAVITREAL

## 2024-04-04 NOTE — Progress Notes (Signed)
 [facility] - Clinic Note  04/04/2024   CHIEF COMPLAINT Patient presents for Retina Follow Up  HISTORY OF PRESENT ILLNESS: Brandon [name] is a 83 y.o. male who presents to the clinic today for:  HPI     Retina Follow Up   Patient presents with  Wet AMD.  In both eyes.  This started 3 months ago.  Duration of 3 months.  Since onset it is stable.  I, the attending physician,  performed the HPI with the patient and updated documentation appropriately.        Comments   3 month retina follow up ARMD OD pt is reporting pt is reporting no vision changes noticed he denies any flashes has some floaters       Last edited by Ronelle Coffee, MD on 04/04/2024  4:58 PM.    Pt has been lost to follow up since January 28th (almost 4 mos), he states he got sick and almost ended up in the hospital, he states he was on 3 different antibiotics, he feels like the injection helped his vision   Referring physician: Ardeth Krabbe, MD [address] [place],  [place] [address]  HISTORICAL INFORMATION:  Selected notes from the MEDICAL RECORD NUMBER Referred by Dr. Artemio Larry for macular degeneration LEE:  Ocular Hx- PMH-   CURRENT MEDICATIONS: No current outpatient medications on file. (Ophthalmic Drugs)   No current facility-administered medications for this visit. (Ophthalmic Drugs)   Current Outpatient Medications (Other)  Medication Sig   aspirin  325 MG tablet Take 1 tablet (325 mg total) by mouth daily.   b complex vitamins capsule Take 1 capsule by mouth 2 (two) times daily.   clobetasol (OLUX) 0.05 % topical foam Apply 1 application topically 2 (two) times daily as needed (rash).    clobetasol cream (TEMOVATE) 0.05 % Apply topically 2 (two) times daily.   famotidine (PEPCID) 10 MG tablet Take 10 mg by mouth 2 (two) times daily as needed for heartburn or indigestion.    loperamide  (IMODIUM  A-D) 2 MG tablet Take 1 tablet (2 mg total) by mouth daily as needed for  diarrhea or loose stools.   metroNIDAZOLE (FLAGYL) 500 MG tablet Take 500 mg by mouth 3 (three) times daily.   nitroGLYCERIN (NITROSTAT) 0.4 MG SL tablet Place 0.4 mg under the tongue every 5 (five) minutes as needed for chest pain.    Omega-3 Krill Oil 450 MG CAPS Take by mouth daily. This is Omega 4  Krill Oil.   OVER THE COUNTER MEDICATION Take 10 drops by mouth daily. Liquid Zinc   OVER THE COUNTER MEDICATION Take 14 drops by mouth daily. Vitamin d3 and k2 combination.   simvastatin (ZOCOR) 20 MG tablet Take 20 mg by mouth at bedtime.    tamsulosin  (FLOMAX ) 0.4 MG CAPS capsule Take 1 capsule (0.4 mg total) by mouth daily.   vitamin B-12 (CYANOCOBALAMIN) 1000 MCG tablet Take 1,000 mcg by mouth daily.   No current facility-administered medications for this visit. (Other)   REVIEW OF SYSTEMS: ROS   Positive for: Gastrointestinal, Musculoskeletal, Cardiovascular, Eyes Last edited by Alise Appl, COT on 04/04/2024  8:29 AM.     ALLERGIES Allergies  Allergen Reactions   Oxycodone    Oxycontin [Oxycodone Hcl]     Hallucinations    PAST MEDICAL HISTORY Past Medical History:  Diagnosis Date   Chest pain, unspecified    Colon cancer (HCC) 1999   Coronary artery disease    GERD (gastroesophageal reflux  disease)    History of gout    History of herniorrhaphy    Hypercholesteremia    Hypertension    Past Surgical History:  Procedure Laterality Date   AMPUTATION OF REPLICATED TOES Right    big toe   APPENDECTOMY     BALLOON DILATION N/A 08/01/2018   Procedure: BALLOON DILATION;  Surgeon: Ruby Corporal, MD;  [facility];  Service: Endoscopy;  Laterality: N/A;   BILIARY STENT PLACEMENT N/A 06/15/2018   Procedure: BILIARY STENT PLACEMENT;  Surgeon: Ruby Corporal, MD;  [facility]: [facility];  Service: Endoscopy;  Laterality: N/A;   CARDIAC CATHETERIZATION     stents x2   CHOLECYSTECTOMY N/A 06/17/2018   Procedure: ATTEMPTED LAPAROSCOPIC CHOLECYSTECTOMY,;   Surgeon: Alanda Allegra, MD;  Location: [facility];  Service: General;  Laterality: N/A;   CHOLECYSTECTOMY N/A 06/17/2018   Procedure: CHOLECYSTECTOMY;  Surgeon: Alanda Allegra, MD;  Location: [facility];  Service: General;  Laterality: N/A;   COLONOSCOPY N/A 11/21/2014   Procedure: COLONOSCOPY;  Surgeon: Ruby Corporal, MD;  [facility];  Service: Endoscopy;  Laterality: N/A;  830   CORONARY ANGIOPLASTY     ERCP N/A 06/15/2018   Procedure: ENDOSCOPIC RETROGRADE CHOLANGIOPANCREATOGRAPHY (ERCP);  Surgeon: Ruby Corporal, MD;  [facility];  Service: Endoscopy;  Laterality: N/A;   ERCP N/A 08/01/2018   Procedure: ENDOSCOPIC RETROGRADE CHOLANGIOPANCREATOGRAPHY (ERCP);  Surgeon: Ruby Corporal, MD;  [facility];  Service: Endoscopy;  Laterality: N/A;   heart stents     HERNIA REPAIR     Incisional hernia   INGUINAL HERNIA REPAIR Right 09/02/2016   Procedure: HERNIA REPAIR INGUINAL ADULT WITH MESH;  Surgeon: Alanda Allegra, MD;  Location: [facility];  Service: General;  Laterality: Right;   Left foot surgery Left    lawn mower accidnet   PARTIAL COLECTOMY     REMOVAL OF STONES N/A 08/01/2018   Procedure: REMOVAL OF STONES;  Surgeon: Ruby Corporal, MD;  [facility];  Service: Endoscopy;  Laterality: N/A;   Right great toe surgery     SPHINCTEROTOMY N/A 06/15/2018   Procedure: SPHINCTEROTOMY;  Surgeon: Ruby Corporal, MD;  [facility];  Service: Endoscopy;  Laterality: N/A;   SPHINCTEROTOMY N/A 08/01/2018   Procedure: SPHINCTEROTOMY;  Surgeon: Ruby Corporal, MD;  [facility];  Service: Endoscopy;  Laterality: N/A;   SPYGLASS CHOLANGIOSCOPY N/A 08/01/2018   Procedure: SPYGLASS CHOLANGIOSCOPY;  Surgeon: Ruby Corporal, MD;  [facility];  Service: Endoscopy;  Laterality: N/A;   STENT REMOVAL N/A 08/01/2018   Procedure: BILIARY STENT REMOVAL;  Surgeon: Ruby Corporal, MD;  [facility];  Service: Endoscopy;   Laterality: N/A;   TONSILLECTOMY     FAMILY HISTORY Family History  Problem Relation Age of Onset   Cerebral aneurysm Mother    Alzheimer's disease Father    SOCIAL HISTORY Social History   Tobacco Use   Smoking status: Former    Current packs/day: 0.00    Average packs/day: 1 pack/day for 30.0 years (30.0 ttl pk-yrs)    Types: Cigarettes    Start date: 08/29/1979    Quit date: 08/28/2009    Years since quitting: 14.6   Smokeless tobacco: Current    Types: Snuff  Vaping Use   Vaping status: Never Used  Substance Use Topics   Alcohol  use: Yes    Comment: socially   Drug use: No  OPHTHALMIC EXAM:  Base Eye Exam     Visual Acuity (Snellen - Linear)       Right Left   Dist [place] 20/70 -1 20/20 -1   Dist ph [place] 20/60 1          Tonometry (Tonopen, 8:36 AM)       Right Left   Pressure 13 11         Pupils       Pupils Dark Light Shape React APD   Right PERRL 4 3 Round Brisk None   Left PERRL 4 3 Round Brisk None         Visual Fields       Left Right    Full Full         Extraocular Movement       Right Left    Full, Ortho Full, Ortho         Neuro/Psych     Oriented x3: Yes   Mood/Affect: Normal         Dilation     Both eyes: 2.5% Phenylephrine @ 8:36 AM           Slit Lamp and Fundus Exam     Slit Lamp Exam       Right Left   Lids/Lashes Dermatochalasis - upper lid Dermatochalasis - upper lid   Conjunctiva/Sclera White and quiet White and quiet   Cornea Mild tear film debris, trace PEE Mild tear film debris, 1+ Punctate epithelial erosions   Anterior Chamber deep, clear, narrow temporal angle deep, clear, narrow temporal angle   Iris Round and dilated Round and dilated   Lens 2-3+ Nuclear sclerosis with mild brunescence, 2-3+ Cortical cataract 2-3+ Nuclear sclerosis, 3+ Cortical cataract   Anterior Vitreous mild syneresis, Posterior vitreous detachment mild syneresis, Posterior vitreous detachment          Fundus Exam       Right Left   Disc Pink and Sharp Pink and Sharp   C/D Ratio 0.2 0.3   Macula central CNV with edema, punctate heme inferiorly -- improved Flat, Blunted foveal reflex, RPE mottling, No heme or edema   Vessels attenuated, Tortuous mild tortuosity   Periphery Attached, reticular degeneration, No heme Attached, reticular degeneration, No heme           IMAGING AND PROCEDURES  Imaging and Procedures for 04/04/2024  OCT, Retina - OU - Both Eyes        Right Eye Quality was good. Central Foveal Thickness: 409. Progression has improved. Findings include no IRF, abnormal foveal contour, subretinal hyper-reflective material, subretinal fluid (Central CNV with [facility] / edema -- slightly improved).   Left Eye Quality was good. Central Foveal Thickness: 277. Progression has been stable. Findings include normal foveal contour, no IRF, no SRF (Rare drusen).   Notes  *Images captured and stored on drive  Diagnosis / Impression:  OD: Central CNV with SRHM / edema -- slightly improved OS: rare drusen -- nonexudative ARMD  Clinical management:  See below  Abbreviations: NFP - Normal foveal profile. CME - cystoid macular edema. PED - pigment epithelial detachment. IRF - intraretinal fluid. SRF - subretinal fluid. EZ - ellipsoid zone. ERM - epiretinal membrane. ORA - outer retinal atrophy. ORT - outer retinal tubulation. SRHM - subretinal hyper-reflective material. IRHM - intraretinal hyper-reflective material      Intravitreal Injection, Pharmacologic Agent - OD - Right Eye       Time Out 04/04/2024. 9:50 AM. Confirmed correct patient,  procedure, site, and patient consented.   Anesthesia Topical anesthesia was used. Anesthetic medications included Lidocaine  2%, Proparacaine 0.5%.   Procedure Preparation included 5% betadine  to ocular surface, eyelid speculum. A supplied needle was used.   Injection: 1.25 mg Bevacizumab  1.25mg /0.33ml   Route: Intravitreal, Site:  Right Eye   NDC: [id number], Lot: [id number], Expiration date: 05/05/2024   Post-op Post injection exam found visual acuity of at least counting fingers. The patient tolerated the procedure well. There were no complications. The patient received written and verbal post procedure care education.           ASSESSMENT/PLAN:   ICD-10-CM   1. Exudative age-related macular degeneration of right eye with active choroidal neovascularization (HCC)  H35.3211 OCT, Retina - OU - Both Eyes    Intravitreal Injection, Pharmacologic Agent - OD - Right Eye    Bevacizumab  (AVASTIN ) SOLN 1.25 mg    2. 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 Essential hypertension  I10     4. Hypertensive retinopathy of both eyes  H35.033     5. Combined forms of age-related cataract of both eyes  H25.813      1. Exudative age related macular degeneration, OD  - lost to follow up from 4 weeks to 16 weeks (01.28.25 - 05.20.25) due to illness  - s/p IVA OD #1 (01.28.25)  - pt reported ~6 mo history of decreased vision OD  - OCT OD shows central CNV with SRHM/edema -- slightly improved  - BCVA OD improved to 20/60 from 20/70  - recommend IVA OD #2 today, 05.20.25  - pt wishes to be treated with IVA  - RBA of procedure discussed, questions answered - IVA informed consent obtained and signed, 01.28.25 - see procedure note  - f/u in 4-6 wks, DFE, OCT  2. Age related macular degeneration, non-exudative OS  - The incidence, anatomy, and pathology of dry AMD, risk of progression, and the AREDS and AREDS 2 study including smoking risks discussed with patient.  - Recommend amsler grid monitoring  - monitor  3,4. Hypertensive retinopathy OU - discussed importance of tight BP control - monitor  5. Mixed Cataract OU - The symptoms of cataract, surgical options, and treatments and risks were discussed with patient. - discussed diagnosis and progression - under the expert  management of Dr. Frederic Jakes - clear from a retina standpoint to proceed with cataract surgery when pt and surgeon are ready   Ophthalmic Meds Ordered this visit:  Meds ordered this encounter  Medications   Bevacizumab  (AVASTIN ) SOLN 1.25 mg     Return for f/u 4-6 weeks, exu ARMD OD, DFE, OCT, Possible Injxn.  There are no Patient Instructions on file for this visit.  Explained the diagnoses, plan, and follow up with the patient and they expressed understanding.  Patient expressed understanding of the importance of proper follow up care.   This document serves as a record of services personally performed by Jeanice Millard, MD, PhD. It was created on their behalf by Morley Arabia. Bevin Bucks, OA an ophthalmic technician. The creation of this record is the provider's dictation and/or activities during the visit.    Electronically signed by: Morley Arabia. Bevin Bucks, OA 04/09/24 1:14 AM  Jeanice Millard, M.D., Ph.D. Diseases & Surgery of the Retina and Vitreous [facility] 04/04/2024  I have reviewed the above documentation for accuracy and completeness, and I agree with the above. Polly Brink  Ernesta Heading, M.D., Ph.D. 04/09/24 1:14 AM   Abbreviations: M myopia (nearsighted); A astigmatism; H hyperopia (farsighted); P presbyopia; Mrx spectacle prescription;  CTL contact lenses; OD right eye; OS left eye; OU both eyes  XT exotropia; ET esotropia; PEK punctate epithelial keratitis; PEE punctate epithelial erosions; DES dry eye syndrome; MGD meibomian gland dysfunction; ATs artificial tears; PFAT's preservative free artificial tears; NSC nuclear sclerotic cataract; PSC posterior subcapsular cataract; ERM epi-retinal membrane; PVD posterior vitreous detachment; RD retinal detachment; DM diabetes mellitus; DR diabetic retinopathy; NPDR non-proliferative diabetic retinopathy; PDR proliferative diabetic retinopathy; CSME clinically significant macular edema; DME diabetic macular edema; dbh dot blot  hemorrhages; CWS cotton wool spot; POAG primary open angle glaucoma; C/D cup-to-disc ratio; HVF humphrey visual field; GVF goldmann visual field; OCT optical coherence tomography; IOP intraocular pressure; BRVO Branch retinal vein occlusion; CRVO central retinal vein occlusion; CRAO central retinal artery occlusion; BRAO branch retinal artery occlusion; RT retinal tear; SB scleral buckle; PPV pars plana vitrectomy; VH Vitreous hemorrhage; PRP panretinal laser photocoagulation; IVK intravitreal kenalog; VMT vitreomacular traction; MH Macular hole;  NVD neovascularization of the disc; NVE neovascularization elsewhere; AREDS age related eye disease study; ARMD age related macular degeneration; POAG primary open angle glaucoma; EBMD epithelial/anterior basement membrane dystrophy; ACIOL anterior chamber intraocular lens; IOL intraocular lens; PCIOL posterior chamber intraocular lens; Phaco/IOL phacoemulsification with intraocular lens placement; PRK photorefractive keratectomy; LASIK laser assisted in situ keratomileusis; HTN hypertension; DM diabetes mellitus; COPD chronic obstructive pulmonary disease

## 2024-04-12 DIAGNOSIS — E291 Testicular hypofunction: Secondary | ICD-10-CM | POA: Diagnosis not present

## 2024-04-18 DIAGNOSIS — H353211 Exudative age-related macular degeneration, right eye, with active choroidal neovascularization: Secondary | ICD-10-CM | POA: Diagnosis not present

## 2024-04-18 DIAGNOSIS — H16223 Keratoconjunctivitis sicca, not specified as Sjogren's, bilateral: Secondary | ICD-10-CM | POA: Diagnosis not present

## 2024-04-18 DIAGNOSIS — H2513 Age-related nuclear cataract, bilateral: Secondary | ICD-10-CM | POA: Diagnosis not present

## 2024-04-25 DIAGNOSIS — E291 Testicular hypofunction: Secondary | ICD-10-CM | POA: Diagnosis not present

## 2024-05-02 DIAGNOSIS — H903 Sensorineural hearing loss, bilateral: Secondary | ICD-10-CM | POA: Diagnosis not present

## 2024-05-04 NOTE — Progress Notes (Signed)
 [facility] - Clinic Note  05/09/2024   CHIEF COMPLAINT Patient presents for No chief complaint on file.  HISTORY OF PRESENT ILLNESS: Brandon [name] is a 83 y.o. male who presents to the clinic today for:   Pt has been lost to follow up since January 28th (almost 4 mos), he states he got sick and almost ended up in the hospital, he states he was on 3 different antibiotics, he feels like the injection helped his vision   Referring physician: Theoplis Fix, MD [address] [place],  [place] [address]  HISTORICAL INFORMATION:  Selected notes from the MEDICAL RECORD NUMBER Referred by Dr. Artemio Larry for macular degeneration LEE:  Ocular Hx- PMH-   CURRENT MEDICATIONS: No current outpatient medications on file. (Ophthalmic Drugs)   No current facility-administered medications for this visit. (Ophthalmic Drugs)   Current Outpatient Medications (Other)  Medication Sig   aspirin  325 MG tablet Take 1 tablet (325 mg total) by mouth daily.   b complex vitamins capsule Take 1 capsule by mouth 2 (two) times daily.   clobetasol (OLUX) 0.05 % topical foam Apply 1 application topically 2 (two) times daily as needed (rash).    clobetasol cream (TEMOVATE) 0.05 % Apply topically 2 (two) times daily.   famotidine (PEPCID) 10 MG tablet Take 10 mg by mouth 2 (two) times daily as needed for heartburn or indigestion.    loperamide  (IMODIUM  A-D) 2 MG tablet Take 1 tablet (2 mg total) by mouth daily as needed for diarrhea or loose stools.   metroNIDAZOLE (FLAGYL) 500 MG tablet Take 500 mg by mouth 3 (three) times daily.   nitroGLYCERIN (NITROSTAT) 0.4 MG SL tablet Place 0.4 mg under the tongue every 5 (five) minutes as needed for chest pain.    Omega-3 Krill Oil 450 MG CAPS Take by mouth daily. This is Omega 4  Krill Oil.   OVER THE COUNTER MEDICATION Take 10 drops by mouth daily. Liquid Zinc   OVER THE COUNTER MEDICATION Take 14 drops by mouth daily. Vitamin d3 and k2 combination.    simvastatin (ZOCOR) 20 MG tablet Take 20 mg by mouth at bedtime.    tamsulosin  (FLOMAX ) 0.4 MG CAPS capsule Take 1 capsule (0.4 mg total) by mouth daily.   vitamin B-12 (CYANOCOBALAMIN) 1000 MCG tablet Take 1,000 mcg by mouth daily.   No current facility-administered medications for this visit. (Other)   REVIEW OF SYSTEMS:   ALLERGIES Allergies  Allergen Reactions   Oxycodone    Oxycontin [Oxycodone Hcl]     Hallucinations    PAST MEDICAL HISTORY Past Medical History:  Diagnosis Date   Chest pain, unspecified    Colon cancer (HCC) 1999   Coronary artery disease    GERD (gastroesophageal reflux disease)    History of gout    History of herniorrhaphy    Hypercholesteremia    Hypertension    Past Surgical History:  Procedure Laterality Date   AMPUTATION OF REPLICATED TOES Right    big toe   APPENDECTOMY     BALLOON DILATION N/A 08/01/2018   Procedure: BALLOON DILATION;  Surgeon: Ruby Corporal, MD;  [facility];  Service: Endoscopy;  Laterality: N/A;   BILIARY STENT PLACEMENT N/A 06/15/2018   Procedure: BILIARY STENT PLACEMENT;  Surgeon: Ruby Corporal, MD;  [facility]: [facility];  Service: Endoscopy;  Laterality: N/A;   CARDIAC CATHETERIZATION     stents x2   CHOLECYSTECTOMY N/A 06/17/2018   Procedure: ATTEMPTED LAPAROSCOPIC CHOLECYSTECTOMY,;  Surgeon: Alanda Allegra, MD;  Location: [facility];  Service: General;  Laterality: N/A;   CHOLECYSTECTOMY N/A 06/17/2018   Procedure: CHOLECYSTECTOMY;  Surgeon: Alanda Allegra, MD;  Location: [facility];  Service: General;  Laterality: N/A;   COLONOSCOPY N/A 11/21/2014   Procedure: COLONOSCOPY;  Surgeon: Ruby Corporal, MD;  [facility];  Service: Endoscopy;  Laterality: N/A;  830   CORONARY ANGIOPLASTY     ERCP N/A 06/15/2018   Procedure: ENDOSCOPIC RETROGRADE CHOLANGIOPANCREATOGRAPHY (ERCP);  Surgeon: Ruby Corporal, MD;  [facility];  Service: Endoscopy;  Laterality: N/A;   ERCP N/A 08/01/2018    Procedure: ENDOSCOPIC RETROGRADE CHOLANGIOPANCREATOGRAPHY (ERCP);  Surgeon: Ruby Corporal, MD;  [facility];  Service: Endoscopy;  Laterality: N/A;   heart stents     HERNIA REPAIR     Incisional hernia   INGUINAL HERNIA REPAIR Right 09/02/2016   Procedure: HERNIA REPAIR INGUINAL ADULT WITH MESH;  Surgeon: Alanda Allegra, MD;  Location: [facility];  Service: General;  Laterality: Right;   Left foot surgery Left    lawn mower accidnet   PARTIAL COLECTOMY     REMOVAL OF STONES N/A 08/01/2018   Procedure: REMOVAL OF STONES;  Surgeon: Ruby Corporal, MD;  [facility];  Service: Endoscopy;  Laterality: N/A;   Right great toe surgery     SPHINCTEROTOMY N/A 06/15/2018   Procedure: SPHINCTEROTOMY;  Surgeon: Ruby Corporal, MD;  [facility];  Service: Endoscopy;  Laterality: N/A;   SPHINCTEROTOMY N/A 08/01/2018   Procedure: SPHINCTEROTOMY;  Surgeon: Ruby Corporal, MD;  [facility];  Service: Endoscopy;  Laterality: N/A;   SPYGLASS CHOLANGIOSCOPY N/A 08/01/2018   Procedure: SPYGLASS CHOLANGIOSCOPY;  Surgeon: Ruby Corporal, MD;  [facility];  Service: Endoscopy;  Laterality: N/A;   STENT REMOVAL N/A 08/01/2018   Procedure: BILIARY STENT REMOVAL;  Surgeon: Ruby Corporal, MD;  [facility];  Service: Endoscopy;  Laterality: N/A;   TONSILLECTOMY     FAMILY HISTORY Family History  Problem Relation Age of Onset   Cerebral aneurysm Mother    Alzheimer's disease Father    SOCIAL HISTORY Social History   Tobacco Use   Smoking status: Former    Current packs/day: 0.00    Average packs/day: 1 pack/day for 30.0 years (30.0 ttl pk-yrs)    Types: Cigarettes    Start date: 08/29/1979    Quit date: 08/28/2009    Years since quitting: 14.6   Smokeless tobacco: Current    Types: Snuff  Vaping Use   Vaping status: Never Used  Substance Use Topics   Alcohol  use: Yes    Comment: socially   Drug use: No       OPHTHALMIC  EXAM:  Not recorded    IMAGING AND PROCEDURES  Imaging and Procedures for 05/09/2024         ASSESSMENT/PLAN:   ICD-10-CM   1. Exudative age-related macular degeneration of right eye with active choroidal neovascularization (HCC)  H35.3211     2. 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 Essential hypertension  I10     4. Hypertensive retinopathy of both eyes  H35.033     5. Combined forms of age-related cataract of both eyes  H25.813      1. Exudative age related macular degeneration, OD  - lost to follow up from 4 weeks to 16 weeks (01.28.25 -  05.20.25) due to illness  - s/p IVA OD #1 (01.28.25), #2 (05.20.25)  - pt reported ~6 mo history of decreased vision OD  - OCT OD shows central CNV with SRHM/edema -- slightly improved  - BCVA OD improved to 20/60 from 20/70  - recommend IVA OD #3 today, 06.24.25  - pt wishes to be treated with IVA  - RBA of procedure discussed, questions answered - IVA informed consent obtained and signed, 01.28.25 - see procedure note  - f/u in 4-6 wks, DFE, OCT  2. Age related macular degeneration, non-exudative OS  - The incidence, anatomy, and pathology of dry AMD, risk of progression, and the AREDS and AREDS 2 study including smoking risks discussed with patient.  - Recommend amsler grid monitoring  - monitor  3,4. Hypertensive retinopathy OU - discussed importance of tight BP control - monitor  5. Mixed Cataract OU - The symptoms of cataract, surgical options, and treatments and risks were discussed with patient. - discussed diagnosis and progression - under the expert management of Dr. Frederic Jakes - clear from a retina standpoint to proceed with cataract surgery when pt and surgeon are ready   Ophthalmic Meds Ordered this visit:  No orders of the defined types were placed in this encounter.    No follow-ups on file.  There are no Patient Instructions on file for this  visit.  Explained the diagnoses, plan, and follow up with the patient and they expressed understanding.  Patient expressed understanding of the importance of proper follow up care.   This document serves as a record of services personally performed by Jeanice Millard, MD, PhD. It was created on their behalf by Morley Arabia. Bevin Bucks, OA an ophthalmic technician. The creation of this record is the provider's dictation and/or activities during the visit.    Electronically signed by: Morley Arabia. Bevin Bucks, OA 05/04/24 11:50 AM  Jeanice Millard, M.D., Ph.D. Diseases & Surgery of the Retina and Vitreous [facility] 05/09/2024    Abbreviations: M myopia (nearsighted); A astigmatism; H hyperopia (farsighted); P presbyopia; Mrx spectacle prescription;  CTL contact lenses; OD right eye; OS left eye; OU both eyes  XT exotropia; ET esotropia; PEK punctate epithelial keratitis; PEE punctate epithelial erosions; DES dry eye syndrome; MGD meibomian gland dysfunction; ATs artificial tears; PFAT's preservative free artificial tears; NSC nuclear sclerotic cataract; PSC posterior subcapsular cataract; ERM epi-retinal membrane; PVD posterior vitreous detachment; RD retinal detachment; DM diabetes mellitus; DR diabetic retinopathy; NPDR non-proliferative diabetic retinopathy; PDR proliferative diabetic retinopathy; CSME clinically significant macular edema; DME diabetic macular edema; dbh dot blot hemorrhages; CWS cotton wool spot; POAG primary open angle glaucoma; C/D cup-to-disc ratio; HVF humphrey visual field; GVF goldmann visual field; OCT optical coherence tomography; IOP intraocular pressure; BRVO Branch retinal vein occlusion; CRVO central retinal vein occlusion; CRAO central retinal artery occlusion; BRAO branch retinal artery occlusion; RT retinal tear; SB scleral buckle; PPV pars plana vitrectomy; VH Vitreous hemorrhage; PRP panretinal laser photocoagulation; IVK intravitreal kenalog; VMT  vitreomacular traction; MH Macular hole;  NVD neovascularization of the disc; NVE neovascularization elsewhere; AREDS age related eye disease study; ARMD age related macular degeneration; POAG primary open angle glaucoma; EBMD epithelial/anterior basement membrane dystrophy; ACIOL anterior chamber intraocular lens; IOL intraocular lens; PCIOL posterior chamber intraocular lens; Phaco/IOL phacoemulsification with intraocular lens placement; PRK photorefractive keratectomy; LASIK laser assisted in situ keratomileusis; HTN hypertension; DM diabetes mellitus; COPD chronic obstructive pulmonary disease

## 2024-05-09 ENCOUNTER — Encounter (INDEPENDENT_AMBULATORY_CARE_PROVIDER_SITE_OTHER): Payer: Self-pay | Admitting: Ophthalmology

## 2024-05-09 ENCOUNTER — Ambulatory Visit (INDEPENDENT_AMBULATORY_CARE_PROVIDER_SITE_OTHER): Admitting: Ophthalmology

## 2024-05-09 DIAGNOSIS — I10 Essential (primary) hypertension: Secondary | ICD-10-CM

## 2024-05-09 DIAGNOSIS — H25813 Combined forms of age-related cataract, bilateral: Secondary | ICD-10-CM | POA: Diagnosis not present

## 2024-05-09 DIAGNOSIS — H353121 Nonexudative age-related macular degeneration, left eye, early dry stage: Secondary | ICD-10-CM | POA: Diagnosis not present

## 2024-05-09 DIAGNOSIS — H353211 Exudative age-related macular degeneration, right eye, with active choroidal neovascularization: Secondary | ICD-10-CM | POA: Diagnosis not present

## 2024-05-09 DIAGNOSIS — H35033 Hypertensive retinopathy, bilateral: Secondary | ICD-10-CM | POA: Diagnosis not present

## 2024-05-09 DIAGNOSIS — E291 Testicular hypofunction: Secondary | ICD-10-CM | POA: Diagnosis not present

## 2024-05-09 MED ORDER — BEVACIZUMAB CHEMO INJECTION 1.25MG/0.05ML SYRINGE FOR KALEIDOSCOPE
1.2500 mg | INTRAVITREAL | Status: AC | PRN
  Administered 2024-05-09: 1.25 mg via INTRAVITREAL

## 2024-05-23 ENCOUNTER — Encounter (HOSPITAL_COMMUNITY)
Admission: RE | Admit: 2024-05-23 | Discharge: 2024-05-23 | Disposition: A | Discharge status: Home / Self Care | Source: Ambulatory Visit | Attending: Optometry | Admitting: Optometry

## 2024-05-23 DIAGNOSIS — E291 Testicular hypofunction: Secondary | ICD-10-CM | POA: Diagnosis not present

## 2024-05-23 NOTE — H&P (Signed)
 Surgical History & Physical  Patient Name: Brandon [name]  DOB: 02/27/1941  Surgery: Cataract extraction with intraocular lens implant phacoemulsification; Right Eye Surgeon: Marsa Cleverly MD Surgery Date: 05/30/2024 Pre-Op Date: 04/18/2024  HPI: A 49 Yr. old male patient present for cataract eval. Patient seen Dr. Valdemar last week and had an injection in his OD. He was told there was an improvement in OD at that appt. Returning 05/11/2024. A red area came up on his LLL after his appt with Dr. Valdemar. Feels like something dropped. Patient is having difficulties OD seeing road signs and reading fine print. This is constant. Unsure duration. This is negatively affecting the patient's quality of life and the patient is unable to function adequately in life with the current level of vision. HPI was performed by Marsa Cleverly .  Medical History: Cataracts   Review of Systems Negative Allergic/Immunologic Negative Cardiovascular Negative Constitutional Negative Ear, Nose, Mouth & Throat Negative Endocrine Negative Eyes Negative Gastrointestinal Negative Genitourinary Negative Hematologic/Lymphatic Negative Integumentary Negative Musculoskeletal Negative Neurological Negative Psychiatry Negative Respiratory  Social Unknown if ever smoked  Medication Pataday Once Daily Relief,  tamsulosin  ,  simvastatin ,  Cholestyramine Light , Testosterone cypionate  Sx/Procedures Intraocular Injections OD,  Gallbladder Removal, Hernia Sx  Drug Allergies NKDA  History & Physical: Heent: cataracts NECK: supple without bruits LUNGS: lungs clear to auscultation CV: regular rate and rhythm Abdomen: soft and non-tender  Impression & Plan: Assessment: 1.  NUCLEAR SCLEROSIS AGE RELATED; Both Eyes (H25.13) 2.  AGE RELATED MACULAR DEGENERATION WET; Right Eye Active neovascularization (H35.3211) 3.  KERATOCONJUNCTIVITIS SICCA NOT SPECIFIED AS SJORGRENS; Both Eyes (Y83.776)  Plan: 1.   Cataracts are visually significant and account for the patient's complaints. Discussed all risks, benefits, procedures and recovery, including infection, loss of vision and eye, need for glasses after surgery or additional procedures. Patient understands changing glasses will not improve vision. Patient indicated understanding of procedure. All questions answered. Patient desires to have surgery, recommend phacoemulsification with intraocular lens. Patient to have preliminary testing necessary ([employer]/IOL Master, Mac OCT, TOPO) Educational materials provided: Cataract.  Plan: - need to use ATs QID and return for measurements one more time - Proceed with cataract surgery OD followed by OS - Plan for best distance target with DIB00 - No DM, no fuchs, no prior eye surgery - good dilation - ARMD and now receiving injections OD, discussed this will limit final acuity - Dextenza if available  2.  Appears to have central CNVM with EZ loss. Patient feels vision has been worse in the right eye for the past 3 years but not sure when it became as bad as current. Also has cataract but would like input from Retina team prior to cataract surgery.  Has now established care with Dr. Zamora and receiving anti-VEGF injections, next on June 24th  3.  Dry eye. Mild signs of dry eye at this time. Can use artificial tears QID OU PRN and warm compresses once daily as needed.

## 2024-05-24 DIAGNOSIS — H2511 Age-related nuclear cataract, right eye: Secondary | ICD-10-CM | POA: Diagnosis not present

## 2024-05-29 NOTE — Anesthesia Preprocedure Evaluation (Signed)
 Anesthesia Evaluation  Patient identified by MRN, date of birth, ID band Patient awake    Reviewed: Allergy & Precautions, H&P , NPO status , Patient's Chart, lab work & pertinent test results, reviewed documented beta blocker date and time   Airway Mallampati: II  TM Distance: >3 FB Neck ROM: full    Dental no notable dental hx. (+) Dental Advisory Given, Teeth Intact   Pulmonary neg pulmonary ROS, former smoker   Pulmonary exam normal breath sounds clear to auscultation       Cardiovascular hypertension, + CAD  Normal cardiovascular exam Rhythm:regular Rate:Normal     Neuro/Psych negative neurological ROS  negative psych ROS   GI/Hepatic negative GI ROS, Neg liver ROS,GERD  ,,  Endo/Other  negative endocrine ROS    Renal/GU negative Renal ROS  negative genitourinary   Musculoskeletal   Abdominal   Peds  Hematology negative hematology ROS (+)   Anesthesia Other Findings Colon CA  Reproductive/Obstetrics negative OB ROS                              Anesthesia Physical Anesthesia Plan  ASA: 3  Anesthesia Plan: MAC   Post-op Pain Management: Minimal or no pain anticipated   Induction:   PONV Risk Score and Plan:   Airway Management Planned: Nasal Cannula and Natural Airway  Additional Equipment: None  Intra-op Plan:   Post-operative Plan:   Informed Consent: I have reviewed the patients History and Physical, chart, labs and discussed the procedure including the risks, benefits and alternatives for the proposed anesthesia with the patient or authorized representative who has indicated his/her understanding and acceptance.     Dental Advisory Given  Plan Discussed with: CRNA  Anesthesia Plan Comments:         Anesthesia Quick Evaluation

## 2024-05-30 ENCOUNTER — Encounter (HOSPITAL_COMMUNITY): Payer: Self-pay | Admitting: Optometry

## 2024-05-30 ENCOUNTER — Encounter (HOSPITAL_COMMUNITY)
Admission: RE | Disposition: A | Discharge status: Home / Self Care | Payer: Self-pay | Source: Home / Self Care | Attending: Optometry

## 2024-05-30 ENCOUNTER — Ambulatory Visit (HOSPITAL_COMMUNITY)
Admission: RE | Admit: 2024-05-30 | Discharge: 2024-05-30 | Disposition: A | Discharge status: Home / Self Care | Attending: Optometry | Admitting: Optometry

## 2024-05-30 ENCOUNTER — Ambulatory Visit (HOSPITAL_COMMUNITY): Payer: Self-pay | Admitting: Anesthesiology

## 2024-05-30 ENCOUNTER — Encounter (HOSPITAL_COMMUNITY): Payer: Self-pay | Admitting: Anesthesiology

## 2024-05-30 DIAGNOSIS — I10 Essential (primary) hypertension: Secondary | ICD-10-CM | POA: Diagnosis not present

## 2024-05-30 DIAGNOSIS — H2511 Age-related nuclear cataract, right eye: Secondary | ICD-10-CM | POA: Insufficient documentation

## 2024-05-30 DIAGNOSIS — H353211 Exudative age-related macular degeneration, right eye, with active choroidal neovascularization: Secondary | ICD-10-CM | POA: Diagnosis not present

## 2024-05-30 DIAGNOSIS — I2510 Atherosclerotic heart disease of native coronary artery without angina pectoris: Secondary | ICD-10-CM | POA: Insufficient documentation

## 2024-05-30 DIAGNOSIS — Z87891 Personal history of nicotine dependence: Secondary | ICD-10-CM

## 2024-05-30 DIAGNOSIS — M3501 Sicca syndrome with keratoconjunctivitis: Secondary | ICD-10-CM | POA: Insufficient documentation

## 2024-05-30 DIAGNOSIS — K219 Gastro-esophageal reflux disease without esophagitis: Secondary | ICD-10-CM | POA: Insufficient documentation

## 2024-05-30 HISTORY — PX: CATARACT EXTRACTION W/PHACO: SHX586

## 2024-05-30 SURGERY — PHACOEMULSIFICATION, CATARACT, WITH IOL INSERTION
Anesthesia: Monitor Anesthesia Care | Site: Eye | Laterality: Right

## 2024-05-30 MED ORDER — STERILE WATER FOR IRRIGATION IR SOLN
Status: DC | PRN
  Administered 2024-05-30: 250 mL

## 2024-05-30 MED ORDER — MIDAZOLAM HCL 2 MG/2ML IJ SOLN
INTRAMUSCULAR | Status: DC | PRN
  Administered 2024-05-30: .5 mg via INTRAVENOUS

## 2024-05-30 MED ORDER — PHENYLEPHRINE HCL 2.5 % OP SOLN
1.0000 [drp] | OPHTHALMIC | Status: AC
  Administered 2024-05-30 (×3): 1 [drp] via OPHTHALMIC

## 2024-05-30 MED ORDER — PHENYLEPHRINE-KETOROLAC 1-0.3 % IO SOLN
INTRAOCULAR | Status: DC | PRN
  Administered 2024-05-30: 500 mL via OPHTHALMIC

## 2024-05-30 MED ORDER — TETRACAINE HCL 0.5 % OP SOLN
1.0000 [drp] | OPHTHALMIC | Status: AC
  Administered 2024-05-30 (×3): 1 [drp] via OPHTHALMIC

## 2024-05-30 MED ORDER — MIDAZOLAM HCL 2 MG/2ML IJ SOLN
INTRAMUSCULAR | Status: AC
  Filled 2024-05-30: qty 2

## 2024-05-30 MED ORDER — POVIDONE-IODINE 5 % OP SOLN
OPHTHALMIC | Status: DC | PRN
  Administered 2024-05-30: 1 via OPHTHALMIC

## 2024-05-30 MED ORDER — LIDOCAINE HCL (PF) 1 % IJ SOLN
INTRAMUSCULAR | Status: DC | PRN
Start: 2024-05-30 — End: 2024-05-30
  Administered 2024-05-30: 1 mL

## 2024-05-30 MED ORDER — LIDOCAINE HCL 3.5 % OP GEL
1.0000 | Freq: Once | OPHTHALMIC | Status: AC
  Administered 2024-05-30: 1 via OPHTHALMIC

## 2024-05-30 MED ORDER — TROPICAMIDE 1 % OP SOLN
1.0000 [drp] | OPHTHALMIC | Status: AC
  Administered 2024-05-30 (×3): 1 [drp] via OPHTHALMIC

## 2024-05-30 MED ORDER — LACTATED RINGERS IV SOLN: INTRAVENOUS | Status: DC

## 2024-05-30 MED ORDER — MOXIFLOXACIN HCL 5 MG/ML IO SOLN
INTRAOCULAR | Status: DC | PRN
  Administered 2024-05-30: .2 mL via INTRACAMERAL

## 2024-05-30 MED ORDER — BSS IO SOLN
INTRAOCULAR | Status: DC | PRN
  Administered 2024-05-30: 15 mL via INTRAOCULAR

## 2024-05-30 MED ORDER — SIGHTPATH DOSE#1 NA HYALUR & NA CHOND-NA HYALUR IO KIT
PACK | INTRAOCULAR | Status: DC | PRN
Start: 2024-05-30 — End: 2024-05-30
  Administered 2024-05-30: 1 via OPHTHALMIC

## 2024-05-30 MED ORDER — SODIUM CHLORIDE 0.9% FLUSH
INTRAVENOUS | Status: DC | PRN
  Administered 2024-05-30: 5 mL via INTRAVENOUS

## 2024-05-30 SURGICAL SUPPLY — 14 items
CATARACT SUITE SIGHTPATH (MISCELLANEOUS) ×1 IMPLANT
CLOTH BEACON ORANGE TIMEOUT ST (SAFETY) ×1 IMPLANT
DRSG TEGADERM 4X4.75 (GAUZE/BANDAGES/DRESSINGS) ×1 IMPLANT
EYE SHIELD UNIVERSAL CLEAR (GAUZE/BANDAGES/DRESSINGS) IMPLANT
FEE CATARACT SUITE SIGHTPATH (MISCELLANEOUS) ×1 IMPLANT
GLOVE BIOGEL PI IND STRL 7.0 (GLOVE) ×2 IMPLANT
LENS IOL TECNIS EYHANCE 22.5 (Intraocular Lens) IMPLANT
NDL HYPO 18GX1.5 BLUNT FILL (NEEDLE) ×1 IMPLANT
NEEDLE HYPO 18GX1.5 BLUNT FILL (NEEDLE) ×1 IMPLANT
PAD ARMBOARD POSITIONER FOAM (MISCELLANEOUS) ×1 IMPLANT
POSITIONER HEAD 8X9X4 ADT (SOFTGOODS) ×1 IMPLANT
SYR TB 1ML LL NO SAFETY (SYRINGE) ×1 IMPLANT
TAPE SURG TRANSPORE 1 IN (GAUZE/BANDAGES/DRESSINGS) IMPLANT
WATER STERILE IRR 250ML POUR (IV SOLUTION) ×1 IMPLANT

## 2024-05-30 NOTE — Transfer of Care (Signed)
 Immediate Anesthesia Transfer of Care Note  Patient: Brandon [name]  Procedure(s) Performed: PHACOEMULSIFICATION, CATARACT, WITH IOL INSERTION (Right: Eye)  Patient Location: Short Stay  Anesthesia Type:MAC  Level of Consciousness: awake, alert , and oriented  Airway & Oxygen Therapy: Patient Spontanous Breathing  Post-op Assessment: Report given to RN and Post -op Vital signs reviewed and stable  Post vital signs: Reviewed and stable  Last Vitals:  Vitals Value Taken Time  BP 127/65 05/30/24 08:29  Temp 36.6 C 05/30/24 08:29  Pulse 52 05/30/24 08:29  Resp 16 05/30/24 08:29  SpO2 100 % 05/30/24 08:29    Last Pain:  Vitals:   05/30/24 0829  TempSrc: Oral  PainSc: 0-No pain         Complications: No notable events documented.

## 2024-05-30 NOTE — Discharge Instructions (Signed)
 Please discharge patient when stable, will follow up today with Dr. Ilsa Iha at the [facility] office immediately following discharge.  Leave shield in place until visit.  All paperwork with discharge instructions will be given at the office.  [facility] Address:  [address]  [place], [place] [address]  Dr. Chaya Jan Phone: [phone]

## 2024-05-30 NOTE — Anesthesia Postprocedure Evaluation (Signed)
 Anesthesia Post Note  Patient: Brandon [name]  Procedure(s) Performed: PHACOEMULSIFICATION, CATARACT, WITH IOL INSERTION (Right: Eye)  Patient location during evaluation: Phase II Anesthesia Type: MAC Level of consciousness: awake and alert Pain management: pain level controlled Vital Signs Assessment: post-procedure vital signs reviewed and stable Respiratory status: spontaneous breathing, nonlabored ventilation and respiratory function stable Cardiovascular status: stable and blood pressure returned to baseline Postop Assessment: no apparent nausea or vomiting Anesthetic complications: no   There were no known notable events for this encounter.   Last Vitals:  Vitals:   05/30/24 0801 05/30/24 0829  BP:  127/65  Pulse: (!) 53 (!) 52  Resp: 20 16  Temp:  36.6 C  SpO2: 100% 100%    Last Pain:  Vitals:   05/30/24 0829  TempSrc: Oral  PainSc: 0-No pain                 Zaina Jenkin L Teana Lindahl

## 2024-05-30 NOTE — Interval H&P Note (Signed)
 History and Physical Interval Note:  05/30/2024 7:48 AM  The H and P was reviewed and updated. The patient was examined.  No changes were found after exam.  The surgical eye was marked.  Brandon [name]

## 2024-05-30 NOTE — Op Note (Signed)
 Date of procedure: 05/30/24  Pre-operative diagnosis: Visually significant age-related nuclear cataract, Right Eye (H25.11)  Post-operative diagnosis: Visually significant age-related nuclear cataract, Right Eye H25.11  Procedure: Removal of cataract via phacoemulsification and insertion of intra-ocular lens J&J DIBOO +22.5D into the capsular bag of the Right Eye  Attending surgeon: Marsa Cleverly, MD  Anesthesia: MAC, Topical Akten   Complications: None  Estimated Blood Loss: <70mL (minimal)  Specimens: None  Implants:  Implant Name Type Inv. Item Serial No. Manufacturer Lot No. LRB No. Used Action  LENS IOL TECNIS EYHANCE 22.5 - [id number] Intraocular Lens LENS IOL TECNIS EYHANCE 22.5 [id number] [employer]  Right 1 Implanted    Indications:  Visually significant age-related cataract, Right Eye  Procedure:  The patient was seen and identified in the pre-operative area. The operative eye was identified and dilated.  The operative eye was marked.  Topical anesthesia was administered to the operative eye.     The patient was then to the operative suite and placed in the supine position.  A timeout was performed confirming the patient, procedure to be performed, and all other relevant information.   The patient's face was prepped and draped in the usual fashion for intra-ocular surgery.  A lid speculum was placed into the operative eye and the surgical microscope moved into place and focused.  A superotemporal paracentesis was created using a 20 gauge paracentesis blade.  BSS mixed with Omidria , followed by 1% lidocaine  was injected into the anterior chamber.  Viscoelastic was injected into the anterior chamber.  A temporal clear-corneal main wound incision was created using a 2.52mm microkeratome.  A continuous curvilinear capsulorrhexis was initiated using an irrigating cystitome and completed using capsulorrhexis forceps.  Hydrodissection and hydrodeliniation were performed.  Viscoelastic  was injected into the anterior chamber.  A phacoemulsification handpiece and a chopper as a second instrument were used to remove the nucleus and epinucleus. The irrigation/aspiration handpiece was used to remove any remaining cortical material.   The capsular bag was reinflated with viscoelastic, checked, and found to be intact.  The intraocular lens was inserted into the capsular bag.  The irrigation/aspiration handpiece was used to remove any remaining viscoelastic.  The clear corneal wound and paracentesis wounds were then hydrated and checked with Weck-Cels to be watertight. Moxifloxacin  was instilled into the anterior chamber.  The lid-speculum and drape were removed. The patient's face was cleaned with a wet and dry 4x4. A clear shield was taped over the eye. The patient was taken to the post-operative care unit in good condition, having tolerated the procedure well.  Post-Op Instructions: The patient will follow up at [facility] for a same day post-operative evaluation and will receive all other orders and instructions.

## 2024-05-31 ENCOUNTER — Encounter (HOSPITAL_COMMUNITY): Payer: Self-pay | Admitting: Optometry

## 2024-06-12 NOTE — Progress Notes (Signed)
 [facility] - Clinic Note  06/13/2024   CHIEF COMPLAINT Patient presents for Retina Follow Up  HISTORY OF PRESENT ILLNESS: Brandon [name] is a 83 y.o. male who presents to the clinic today for:  HPI     Retina Follow Up   Patient presents with  Wet AMD.  In right eye.  This started 5 weeks ago.  I, the attending physician,  performed the HPI with the patient and updated documentation appropriately.        Comments   Patient here for 5 weeks retina follow up for exu ARMD OD. Patient states vision had surgery OD couple weeks ago OD. Still cloudy. No eye pain. Using the combo drops BID OD.      Last edited by Lynzi Meulemans, MD on 06/14/2024  1:00 AM.    Pt states he has cataract sx OD with Dr. Juli a few weeks ago, he feels like his vision gets better every day   Referring physician: Maree Isles, MD [address] [place],  [place] [address]  HISTORICAL INFORMATION:  Selected notes from the MEDICAL RECORD NUMBER Referred by Dr. Juli for macular degeneration LEE:  Ocular Hx- PMH-   CURRENT MEDICATIONS: No current outpatient medications on file. (Ophthalmic Drugs)   No current facility-administered medications for this visit. (Ophthalmic Drugs)   Current Outpatient Medications (Other)  Medication Sig   clobetasol (OLUX) 0.05 % topical foam Apply 1 application topically 2 (two) times daily as needed (rash).    clobetasol cream (TEMOVATE) 0.05 % Apply topically 2 (two) times daily.   simvastatin (ZOCOR) 20 MG tablet Take 20 mg by mouth at bedtime.    tamsulosin  (FLOMAX ) 0.4 MG CAPS capsule Take 1 capsule (0.4 mg total) by mouth daily.   aspirin  325 MG tablet Take 1 tablet (325 mg total) by mouth daily.   b complex vitamins capsule Take 1 capsule by mouth 2 (two) times daily.   famotidine (PEPCID) 10 MG tablet Take 10 mg by mouth 2 (two) times daily as needed for heartburn or indigestion.    loperamide  (IMODIUM  A-D) 2 MG tablet Take 1 tablet (2 mg total) by  mouth daily as needed for diarrhea or loose stools.   nitroGLYCERIN (NITROSTAT) 0.4 MG SL tablet Place 0.4 mg under the tongue every 5 (five) minutes as needed for chest pain.    Omega-3 Krill Oil 450 MG CAPS Take by mouth daily. This is Omega 4  Krill Oil.   OVER THE COUNTER MEDICATION Take 10 drops by mouth daily. Liquid Zinc   OVER THE COUNTER MEDICATION Take 14 drops by mouth daily. Vitamin d3 and k2 combination.   vitamin B-12 (CYANOCOBALAMIN) 1000 MCG tablet Take 1,000 mcg by mouth daily.   No current facility-administered medications for this visit. (Other)   REVIEW OF SYSTEMS: ROS   Positive for: Gastrointestinal, Musculoskeletal, Cardiovascular, Eyes Last edited by Orval Asberry RAMAN, COA on 06/13/2024  8:10 AM.       ALLERGIES Allergies  Allergen Reactions   Oxycodone    Oxycontin [Oxycodone Hcl]     Hallucinations    PAST MEDICAL HISTORY Past Medical History:  Diagnosis Date   Chest pain, unspecified    Colon cancer (HCC) 1999   Coronary artery disease    GERD (gastroesophageal reflux disease)    History of gout    History of herniorrhaphy    Hypercholesteremia    Hypertension    Past Surgical History:  Procedure Laterality Date   AMPUTATION OF  REPLICATED TOES Right    big toe   APPENDECTOMY     BALLOON DILATION N/A 08/01/2018   Procedure: BALLOON DILATION;  Surgeon: Golda Claudis PENNER, MD;  [facility];  Service: Endoscopy;  Laterality: N/A;   BILIARY STENT PLACEMENT N/A 06/15/2018   Procedure: BILIARY STENT PLACEMENT;  Surgeon: Golda Claudis PENNER, MD;  [facility]: [facility];  Service: Endoscopy;  Laterality: N/A;   CARDIAC CATHETERIZATION     stents x2   CATARACT EXTRACTION W/PHACO Right 05/30/2024   Procedure: PHACOEMULSIFICATION, CATARACT, WITH IOL INSERTION;  Surgeon: Juli Blunt, MD;  Location: [facility];  Service: Ophthalmology;  Laterality: Right;  CDE: 11.65   CHOLECYSTECTOMY N/A 06/17/2018   Procedure: ATTEMPTED LAPAROSCOPIC  CHOLECYSTECTOMY,;  Surgeon: Mavis Anes, MD;  Location: [facility];  Service: General;  Laterality: N/A;   CHOLECYSTECTOMY N/A 06/17/2018   Procedure: CHOLECYSTECTOMY;  Surgeon: Mavis Anes, MD;  Location: [facility];  Service: General;  Laterality: N/A;   COLONOSCOPY N/A 11/21/2014   Procedure: COLONOSCOPY;  Surgeon: Claudis PENNER Golda, MD;  [facility]: [facility];  Service: Endoscopy;  Laterality: N/A;  830   CORONARY ANGIOPLASTY     ERCP N/A 06/15/2018   Procedure: ENDOSCOPIC RETROGRADE CHOLANGIOPANCREATOGRAPHY (ERCP);  Surgeon: Golda Claudis PENNER, MD;  [facility]: [facility];  Service: Endoscopy;  Laterality: N/A;   ERCP N/A 08/01/2018   Procedure: ENDOSCOPIC RETROGRADE CHOLANGIOPANCREATOGRAPHY (ERCP);  Surgeon: Golda Claudis PENNER, MD;  [facility]: [facility];  Service: Endoscopy;  Laterality: N/A;   heart stents     HERNIA REPAIR     Incisional hernia   INGUINAL HERNIA REPAIR Right 09/02/2016   Procedure: HERNIA REPAIR INGUINAL ADULT WITH MESH;  Surgeon: Anes Mavis, MD;  Location: [facility];  Service: General;  Laterality: Right;   Left foot surgery Left    lawn mower accidnet   PARTIAL COLECTOMY     REMOVAL OF STONES N/A 08/01/2018   Procedure: REMOVAL OF STONES;  Surgeon: Golda Claudis PENNER, MD;  [facility];  Service: Endoscopy;  Laterality: N/A;   Right great toe surgery     SPHINCTEROTOMY N/A 06/15/2018   Procedure: SPHINCTEROTOMY;  Surgeon: Golda Claudis PENNER, MD;  [facility];  Service: Endoscopy;  Laterality: N/A;   SPHINCTEROTOMY N/A 08/01/2018   Procedure: SPHINCTEROTOMY;  Surgeon: Golda Claudis PENNER, MD;  [facility];  Service: Endoscopy;  Laterality: N/A;   SPYGLASS CHOLANGIOSCOPY N/A 08/01/2018   Procedure: SPYGLASS CHOLANGIOSCOPY;  Surgeon: Golda Claudis PENNER, MD;  [facility];  Service: Endoscopy;  Laterality: N/A;   STENT REMOVAL N/A 08/01/2018   Procedure: BILIARY STENT REMOVAL;  Surgeon: Golda Claudis PENNER, MD;  [facility];  Service:  Endoscopy;  Laterality: N/A;   TONSILLECTOMY     FAMILY HISTORY Family History  Problem Relation Age of Onset   Cerebral aneurysm Mother    Alzheimer's disease Father    SOCIAL HISTORY Social History   Tobacco Use   Smoking status: Former    Current packs/day: 0.00    Average packs/day: 1 pack/day for 30.0 years (30.0 ttl pk-yrs)    Types: Cigarettes    Start date: 08/29/1979    Quit date: 08/28/2009    Years since quitting: 14.8   Smokeless tobacco: Current    Types: Snuff  Vaping Use   Vaping status: Never Used  Substance Use Topics   Alcohol  use: Yes    Comment: socially   Drug use: No  OPHTHALMIC EXAM:  Base Eye Exam     Visual Acuity (Snellen - Linear)       Right Left   Dist  20/70 -2 20/20 -2         Tonometry (Tonopen, 8:08 AM)       Right Left   Pressure 13 12         Pupils       Dark Light Shape React APD   Right 4 3 Round Brisk None   Left 4 3 Round Brisk None         Visual Fields (Counting fingers)       Left Right    Full Full         Extraocular Movement       Right Left    Full, Ortho Full, Ortho         Neuro/Psych     Oriented x3: Yes   Mood/Affect: Normal         Dilation     Both eyes: 1.0% Mydriacyl , 2.5% Phenylephrine  @ 8:08 AM           Slit Lamp and Fundus Exam     Slit Lamp Exam       Right Left   Lids/Lashes Dermatochalasis - upper lid Dermatochalasis - upper lid   Conjunctiva/Sclera White and quiet White and quiet   Cornea Mild tear film debris, trace PEE, well healed cataract wound Mild tear film debris, 1+ Punctate epithelial erosions   Anterior Chamber deep, clear, narrow temporal angle deep, clear, narrow temporal angle   Iris Round and moderately dilated, TID IT quad Round and dilated   Lens PC IOL in good position, 1+ Posterior capsular opacification 2-3+ Nuclear sclerosis, 3+ Cortical cataract   Anterior Vitreous mild syneresis, Posterior vitreous detachment mild  syneresis, Posterior vitreous detachment         Fundus Exam       Right Left   Disc Pink and Sharp Pink and Sharp   C/D Ratio 0.2 0.3   Macula central CNV with edema, punctate heme inferiorly -- stably improved Flat, Blunted foveal reflex, RPE mottling, No heme or edema   Vessels attenuated, Tortuous mild attenuation, mild tortuosity   Periphery Attached, reticular degeneration, No heme Attached, reticular degeneration, No heme           IMAGING AND PROCEDURES  Imaging and Procedures for 06/13/2024  OCT, Retina - OU - Both Eyes       Right Eye Quality was good. Central Foveal Thickness: 347. Progression has improved. Findings include no IRF, no SRF, abnormal foveal contour, subretinal hyper-reflective material (Central CNV with [facility] / edema -- slightly improved -- no fluid).   Left Eye Quality was good. Central Foveal Thickness: 281. Progression has been stable. Findings include normal foveal contour, no IRF, no SRF (Rare drusen).   Notes *Images captured and stored on drive  Diagnosis / Impression:  OD: exudative ARMD - Central CNV with SRHM / edema -- slightly improved OS: rare drusen -- nonexudative ARMD  Clinical management:  See below  Abbreviations: NFP - Normal foveal profile. CME - cystoid macular edema. PED - pigment epithelial detachment. IRF - intraretinal fluid. SRF - subretinal fluid. EZ - ellipsoid zone. ERM - epiretinal membrane. ORA - outer retinal atrophy. ORT - outer retinal tubulation. SRHM - subretinal hyper-reflective material. IRHM - intraretinal hyper-reflective material      Intravitreal Injection, Pharmacologic Agent - OD - Right Eye  Time Out 06/13/2024. 9:12 AM. Confirmed correct patient, procedure, site, and patient consented.   Anesthesia Topical anesthesia was used. Anesthetic medications included Lidocaine  2%, Proparacaine 0.5%.   Procedure Preparation included 5% betadine  to ocular surface, eyelid speculum. A (32g) needle was  used.   Injection: 1.25 mg Bevacizumab  1.25mg /0.69ml   Route: Intravitreal, Site: Right Eye   NDC: H525437, Lot: [id number], Expiration date: 08/17/2024   Post-op Post injection exam found visual acuity of at least counting fingers. The patient tolerated the procedure well. There were no complications. The patient received written and verbal post procedure care education.           ASSESSMENT/PLAN:   ICD-10-CM   1. Exudative age-related macular degeneration of right eye with active choroidal neovascularization (HCC)  H35.3211 OCT, Retina - OU - Both Eyes    Intravitreal Injection, Pharmacologic Agent - OD - Right Eye    Bevacizumab  (AVASTIN ) SOLN 1.25 mg    2. 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 Essential hypertension  I10     4. Hypertensive retinopathy of both eyes  H35.033     5. Combined forms of age-related cataract of left eye  H25.812     6. Pseudophakia  Z96.1      1. Exudative age related macular degeneration, OD  - lost to follow up from 4 weeks to 16 weeks (01.28.25 - 05.20.25) due to illness  - s/p IVA OD #1 (01.28.25), #2 (05.20.25), #3 (06.24.25)  - pt reported ~6 mo history of decreased vision OD  - OCT OD shows central CNV with SRHM/edema -- slightly improved at 5 weeks  - BCVA OD stable at 20/70   - recommend IVA OD #4 today, 07.29.25 with follow up extended to 6 weeks  - pt wishes to be treated with IVA  - RBA of procedure discussed, questions answered - IVA informed consent obtained and signed, 01.28.25 - see procedure note  - f/u in 6 wks, DFE, OCT  2. Age related macular degeneration, non-exudative OS  - The incidence, anatomy, and pathology of dry AMD, risk of progression, and the AREDS and AREDS 2 study including smoking risks discussed with patient.  - Recommend amsler grid monitoring  - monitor  3,4. Hypertensive retinopathy OU - discussed importance of tight BP control - monitor  5.  Mixed Cataract OS - The symptoms of cataract, surgical options, and treatments and risks were discussed with patient. - discussed diagnosis and progression - under the expert management of Dr. Marolyn Cleverly - clear from a retina standpoint to proceed with cataract surgery when both patient and surgeon are ready  6. Pseudophakia OD  - s/p CE/IOL OD (Dr. Cleverly, 07.15.25)  - IOL in good position, doing well  - monitor   Ophthalmic Meds Ordered this visit:  Meds ordered this encounter  Medications   Bevacizumab  (AVASTIN ) SOLN 1.25 mg     Return in about 6 weeks (around 07/25/2024) for f/u exu ARMD OD, DFE, OCT, Possible Injxn.  There are no Patient Instructions on file for this visit.  Explained the diagnoses, plan, and follow up with the patient and they expressed understanding.  Patient expressed understanding of the importance of proper follow up care.   This document serves as a record of services personally performed by Redell JUDITHANN Hans, MD, PhD. It was created on their behalf by Auston Muzzy, COMT. The creation of this record is the provider's dictation and/or activities during the  visit.  Electronically signed by: Auston Muzzy, COMT 06/17/24 1:52 AM   This document serves as a record of services personally performed by Redell JUDITHANN Hans, MD, PhD. It was created on their behalf by Alan PARAS. Delores, OA an ophthalmic technician. The creation of this record is the provider's dictation and/or activities during the visit.    Electronically signed by: Alan PARAS. Delores, OA 06/17/24 1:52 AM   Redell JUDITHANN Hans, M.D., Ph.D. Diseases & Surgery of the Retina and Vitreous [facility]  I have reviewed the above documentation for accuracy and completeness, and I agree with the above. Redell JUDITHANN Hans, M.D., Ph.D. 06/17/24 1:56 AM   Abbreviations: M myopia (nearsighted); A astigmatism; H hyperopia (farsighted); P presbyopia; Mrx spectacle prescription;  CTL contact lenses; OD  right eye; OS left eye; OU both eyes  XT exotropia; ET esotropia; PEK punctate epithelial keratitis; PEE punctate epithelial erosions; DES dry eye syndrome; MGD meibomian gland dysfunction; ATs artificial tears; PFAT's preservative free artificial tears; NSC nuclear sclerotic cataract; PSC posterior subcapsular cataract; ERM epi-retinal membrane; PVD posterior vitreous detachment; RD retinal detachment; DM diabetes mellitus; DR diabetic retinopathy; NPDR non-proliferative diabetic retinopathy; PDR proliferative diabetic retinopathy; CSME clinically significant macular edema; DME diabetic macular edema; dbh dot blot hemorrhages; CWS cotton wool spot; POAG primary open angle glaucoma; C/D cup-to-disc ratio; HVF humphrey visual field; GVF goldmann visual field; OCT optical coherence tomography; IOP intraocular pressure; BRVO Branch retinal vein occlusion; CRVO central retinal vein occlusion; CRAO central retinal artery occlusion; BRAO branch retinal artery occlusion; RT retinal tear; SB scleral buckle; PPV pars plana vitrectomy; VH Vitreous hemorrhage; PRP panretinal laser photocoagulation; IVK intravitreal kenalog; VMT vitreomacular traction; MH Macular hole;  NVD neovascularization of the disc; NVE neovascularization elsewhere; AREDS age related eye disease study; ARMD age related macular degeneration; POAG primary open angle glaucoma; EBMD epithelial/anterior basement membrane dystrophy; ACIOL anterior chamber intraocular lens; IOL intraocular lens; PCIOL posterior chamber intraocular lens; Phaco/IOL phacoemulsification with intraocular lens placement; PRK photorefractive keratectomy; LASIK laser assisted in situ keratomileusis; HTN hypertension; DM diabetes mellitus; COPD chronic obstructive pulmonary disease

## 2024-06-13 ENCOUNTER — Ambulatory Visit (INDEPENDENT_AMBULATORY_CARE_PROVIDER_SITE_OTHER): Admitting: Ophthalmology

## 2024-06-13 ENCOUNTER — Encounter (INDEPENDENT_AMBULATORY_CARE_PROVIDER_SITE_OTHER): Payer: Self-pay | Admitting: Ophthalmology

## 2024-06-13 DIAGNOSIS — H25812 Combined forms of age-related cataract, left eye: Secondary | ICD-10-CM | POA: Diagnosis not present

## 2024-06-13 DIAGNOSIS — H35033 Hypertensive retinopathy, bilateral: Secondary | ICD-10-CM | POA: Diagnosis not present

## 2024-06-13 DIAGNOSIS — H353121 Nonexudative age-related macular degeneration, left eye, early dry stage: Secondary | ICD-10-CM

## 2024-06-13 DIAGNOSIS — H353211 Exudative age-related macular degeneration, right eye, with active choroidal neovascularization: Secondary | ICD-10-CM

## 2024-06-13 DIAGNOSIS — H25813 Combined forms of age-related cataract, bilateral: Secondary | ICD-10-CM

## 2024-06-13 DIAGNOSIS — Z961 Presence of intraocular lens: Secondary | ICD-10-CM | POA: Diagnosis not present

## 2024-06-13 DIAGNOSIS — I10 Essential (primary) hypertension: Secondary | ICD-10-CM

## 2024-06-14 ENCOUNTER — Encounter (INDEPENDENT_AMBULATORY_CARE_PROVIDER_SITE_OTHER): Payer: Self-pay | Admitting: Ophthalmology

## 2024-06-14 DIAGNOSIS — R7989 Other specified abnormal findings of blood chemistry: Secondary | ICD-10-CM | POA: Diagnosis not present

## 2024-06-14 MED ORDER — BEVACIZUMAB CHEMO INJECTION 1.25MG/0.05ML SYRINGE FOR KALEIDOSCOPE
1.2500 mg | INTRAVITREAL | Status: AC | PRN
  Administered 2024-06-14: 1.25 mg via INTRAVITREAL

## 2024-06-20 ENCOUNTER — Encounter (HOSPITAL_COMMUNITY)

## 2024-06-20 DIAGNOSIS — E291 Testicular hypofunction: Secondary | ICD-10-CM | POA: Diagnosis not present

## 2024-06-23 ENCOUNTER — Ambulatory Visit (HOSPITAL_COMMUNITY): Admit: 2024-06-23 | Admitting: Optometry

## 2024-06-23 SURGERY — PHACOEMULSIFICATION, CATARACT, WITH IOL INSERTION
Anesthesia: Monitor Anesthesia Care | Laterality: Left

## 2024-07-04 DIAGNOSIS — E291 Testicular hypofunction: Secondary | ICD-10-CM | POA: Diagnosis not present

## 2024-07-13 NOTE — Progress Notes (Signed)
 [facility] - Clinic Note  07/25/2024   CHIEF COMPLAINT Patient presents for Retina Follow Up  HISTORY OF PRESENT ILLNESS: Brandon [name] is a 83 y.o. male who presents to the clinic today for:  HPI     Retina Follow Up   Patient presents with  Wet AMD.  In right eye.  This started 5 weeks ago.  I, the attending physician,  performed the HPI with the patient and updated documentation appropriately.        Comments   Patient feels the vision gets a little better every time he comes here.  He is using AT's PRN.       Last edited by Valdemar Rogue, MD on 07/25/2024  8:44 AM.     Pt states does feel like VA has improved OD. Pt was given a new rx from Dr. Juli.   Referring physician: Maree Isles, MD [address] [place],  [place] [address]  HISTORICAL INFORMATION:  Selected notes from the MEDICAL RECORD NUMBER Referred by Dr. Juli for macular degeneration LEE:  Ocular Hx- PMH-   CURRENT MEDICATIONS: No current outpatient medications on file. (Ophthalmic Drugs)   No current facility-administered medications for this visit. (Ophthalmic Drugs)   Current Outpatient Medications (Other)  Medication Sig   aspirin  325 MG tablet Take 1 tablet (325 mg total) by mouth daily.   b complex vitamins capsule Take 1 capsule by mouth 2 (two) times daily.   clobetasol (OLUX) 0.05 % topical foam Apply 1 application topically 2 (two) times daily as needed (rash).    clobetasol cream (TEMOVATE) 0.05 % Apply topically 2 (two) times daily.   famotidine (PEPCID) 10 MG tablet Take 10 mg by mouth 2 (two) times daily as needed for heartburn or indigestion.    loperamide  (IMODIUM  A-D) 2 MG tablet Take 1 tablet (2 mg total) by mouth daily as needed for diarrhea or loose stools.   nitroGLYCERIN (NITROSTAT) 0.4 MG SL tablet Place 0.4 mg under the tongue every 5 (five) minutes as needed for chest pain.    Omega-3 Krill Oil 450 MG CAPS Take by mouth daily. This is Omega 4  Krill Oil.    OVER THE COUNTER MEDICATION Take 10 drops by mouth daily. Liquid Zinc   OVER THE COUNTER MEDICATION Take 14 drops by mouth daily. Vitamin d3 and k2 combination.   simvastatin (ZOCOR) 20 MG tablet Take 20 mg by mouth at bedtime.    tamsulosin  (FLOMAX ) 0.4 MG CAPS capsule Take 1 capsule (0.4 mg total) by mouth daily.   vitamin B-12 (CYANOCOBALAMIN) 1000 MCG tablet Take 1,000 mcg by mouth daily.   No current facility-administered medications for this visit. (Other)   REVIEW OF SYSTEMS: ROS   Positive for: Gastrointestinal, Musculoskeletal, Cardiovascular, Eyes Last edited by Myra Wanda SAILOR, COT on 07/25/2024  7:43 AM.     ALLERGIES Allergies  Allergen Reactions   Oxycodone    Oxycontin [Oxycodone Hcl]     Hallucinations    PAST MEDICAL HISTORY Past Medical History:  Diagnosis Date   Chest pain, unspecified    Colon cancer (HCC) 1999   Coronary artery disease    GERD (gastroesophageal reflux disease)    History of gout    History of herniorrhaphy    Hypercholesteremia    Hypertension    Past Surgical History:  Procedure Laterality Date   AMPUTATION OF REPLICATED TOES Right    big toe   APPENDECTOMY     BALLOON DILATION N/A 08/01/2018  Procedure: BALLOON DILATION;  Surgeon: Golda Claudis PENNER, MD;  [facility];  Service: Endoscopy;  Laterality: N/A;   BILIARY STENT PLACEMENT N/A 06/15/2018   Procedure: BILIARY STENT PLACEMENT;  Surgeon: Golda Claudis PENNER, MD;  [facility];  Service: Endoscopy;  Laterality: N/A;   CARDIAC CATHETERIZATION     stents x2   CATARACT EXTRACTION W/PHACO Right 05/30/2024   Procedure: PHACOEMULSIFICATION, CATARACT, WITH IOL INSERTION;  Surgeon: Juli Blunt, MD;  Location: [facility];  Service: Ophthalmology;  Laterality: Right;  CDE: 11.65   CHOLECYSTECTOMY N/A 06/17/2018   Procedure: ATTEMPTED LAPAROSCOPIC CHOLECYSTECTOMY,;  Surgeon: Mavis Anes, MD;  Location: [facility];  Service: General;  Laterality: N/A;   CHOLECYSTECTOMY  N/A 06/17/2018   Procedure: CHOLECYSTECTOMY;  Surgeon: Mavis Anes, MD;  Location: [facility];  Service: General;  Laterality: N/A;   COLONOSCOPY N/A 11/21/2014   Procedure: COLONOSCOPY;  Surgeon: Claudis PENNER Golda, MD;  [facility];  Service: Endoscopy;  Laterality: N/A;  830   CORONARY ANGIOPLASTY     ERCP N/A 06/15/2018   Procedure: ENDOSCOPIC RETROGRADE CHOLANGIOPANCREATOGRAPHY (ERCP);  Surgeon: Golda Claudis PENNER, MD;  [facility];  Service: Endoscopy;  Laterality: N/A;   ERCP N/A 08/01/2018   Procedure: ENDOSCOPIC RETROGRADE CHOLANGIOPANCREATOGRAPHY (ERCP);  Surgeon: Golda Claudis PENNER, MD;  [facility];  Service: Endoscopy;  Laterality: N/A;   heart stents     HERNIA REPAIR     Incisional hernia   INGUINAL HERNIA REPAIR Right 09/02/2016   Procedure: HERNIA REPAIR INGUINAL ADULT WITH MESH;  Surgeon: Anes Mavis, MD;  Location: [facility];  Service: General;  Laterality: Right;   Left foot surgery Left    lawn mower accidnet   PARTIAL COLECTOMY     REMOVAL OF STONES N/A 08/01/2018   Procedure: REMOVAL OF STONES;  Surgeon: Golda Claudis PENNER, MD;  [facility];  Service: Endoscopy;  Laterality: N/A;   Right great toe surgery     SPHINCTEROTOMY N/A 06/15/2018   Procedure: SPHINCTEROTOMY;  Surgeon: Golda Claudis PENNER, MD;  [facility];  Service: Endoscopy;  Laterality: N/A;   SPHINCTEROTOMY N/A 08/01/2018   Procedure: SPHINCTEROTOMY;  Surgeon: Golda Claudis PENNER, MD;  [facility];  Service: Endoscopy;  Laterality: N/A;   SPYGLASS CHOLANGIOSCOPY N/A 08/01/2018   Procedure: SPYGLASS CHOLANGIOSCOPY;  Surgeon: Golda Claudis PENNER, MD;  [facility];  Service: Endoscopy;  Laterality: N/A;   STENT REMOVAL N/A 08/01/2018   Procedure: BILIARY STENT REMOVAL;  Surgeon: Golda Claudis PENNER, MD;  [facility];  Service: Endoscopy;  Laterality: N/A;   TONSILLECTOMY     FAMILY HISTORY Family History  Problem Relation Age of Onset    Cerebral aneurysm Mother    Alzheimer's disease Father    SOCIAL HISTORY Social History   Tobacco Use   Smoking status: Former    Current packs/day: 0.00    Average packs/day: 1 pack/day for 30.0 years (30.0 ttl pk-yrs)    Types: Cigarettes    Start date: 08/29/1979    Quit date: 08/28/2009    Years since quitting: 14.9   Smokeless tobacco: Current    Types: Snuff  Vaping Use   Vaping status: Never Used  Substance Use Topics   Alcohol  use: Yes    Comment: socially   Drug use: No       OPHTHALMIC EXAM:  Base Eye Exam     Visual Acuity (Snellen - Linear)  Right Left   Dist [place]  20/30   Dist cc 20/60    Dist ph [place]  20/25 +1   Dist ph cc NI          Tonometry (Tonopen, 7:50 AM)       Right Left   Pressure 17 15         Pupils       Dark Light Shape React APD   Right 4 3 Round Brisk None   Left 4 3 Round Brisk None         Visual Fields       Left Right    Full Full         Extraocular Movement       Right Left    Full, Ortho Full, Ortho         Neuro/Psych     Oriented x3: Yes   Mood/Affect: Normal         Dilation     Both eyes: 2.5% Phenylephrine , 1.0% Mydriacyl  @ 7:44 AM           Slit Lamp and Fundus Exam     Slit Lamp Exam       Right Left   Lids/Lashes Dermatochalasis - upper lid Dermatochalasis - upper lid   Conjunctiva/Sclera White and quiet White and quiet   Cornea Mild tear film debris, trace PEE, well healed cataract wound Mild tear film debris, 1+ Punctate epithelial erosions   Anterior Chamber deep, clear, narrow temporal angle deep, clear, narrow temporal angle   Iris Round and moderately dilated, TID IT quad Round and dilated   Lens PC IOL in good position, 1+ Posterior capsular opacification 2-3+ Nuclear sclerosis, 3+ Cortical cataract   Anterior Vitreous mild syneresis, Posterior vitreous detachment mild syneresis, Posterior vitreous detachment         Fundus Exam       Right Left   Disc Pink  and Sharp Pink and Sharp   C/D Ratio 0.2 0.3   Macula central CNV with edema--improved now w/ pigment clumping, punctate heme inferiorly -- stably improved Flat, Blunted foveal reflex, RPE mottling, No heme or edema   Vessels attenuated, Tortuous mild attenuation, mild tortuosity   Periphery Attached, reticular degeneration, No heme Attached, reticular degeneration, No heme           IMAGING AND PROCEDURES  Imaging and Procedures for 07/25/2024  OCT, Retina - OU - Both Eyes       Right Eye Quality was good. Central Foveal Thickness: 251. Progression has improved. Findings include normal foveal contour, no IRF, no SRF, subretinal hyper-reflective material, outer retinal atrophy (Central CNV with interval improvement in central [facility] and foveal contour, no fluid. Central ORA).   Left Eye Quality was good. Central Foveal Thickness: 280. Progression has been stable. Findings include normal foveal contour, no IRF, no SRF (Rare drusen).   Notes *Images captured and stored on drive  Diagnosis / Impression:  OD: exudative ARMD - Central CNV with interval improvement in central [facility] and foveal contour, no fluid. Central ORA OS: rare drusen -- nonexudative ARMD  Clinical management:  See below  Abbreviations: NFP - Normal foveal profile. CME - cystoid macular edema. PED - pigment epithelial detachment. IRF - intraretinal fluid. SRF - subretinal fluid. EZ - ellipsoid zone. ERM - epiretinal membrane. ORA - outer retinal atrophy. ORT - outer retinal tubulation. SRHM - subretinal hyper-reflective material. IRHM - intraretinal hyper-reflective material      Intravitreal Injection, Pharmacologic  Agent - OD - Right Eye       Time Out 07/25/2024. 8:19 AM. Confirmed correct patient, procedure, site, and patient consented.   Anesthesia Topical anesthesia was used. Anesthetic medications included Lidocaine  2%, Proparacaine 0.5%.   Procedure Preparation included 5% betadine  to ocular surface,  eyelid speculum. A supplied (32g) needle was used.   Injection: 1.25 mg Bevacizumab  1.25mg /0.2ml   Route: Intravitreal, Site: Right Eye   NDC: [id number], Lot: [id number], Expiration date: 08/06/2024   Post-op Post injection exam found visual acuity of at least counting fingers. The patient tolerated the procedure well. There were no complications. The patient received written and verbal post procedure care education.           ASSESSMENT/PLAN:   ICD-10-CM   1. Exudative age-related macular degeneration of right eye with active choroidal neovascularization (HCC)  H35.3211 OCT, Retina - OU - Both Eyes    Intravitreal Injection, Pharmacologic Agent - OD - Right Eye    Bevacizumab  (AVASTIN ) SOLN 1.25 mg    2. 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 Essential hypertension  I10     4. Hypertensive retinopathy of both eyes  H35.033     5. Combined forms of age-related cataract of left eye  H25.812     6. Pseudophakia  Z96.1      1. Exudative age related macular degeneration, OD  - lost to follow up from 4 weeks to 16 weeks (01.28.25 - 05.20.25) due to illness  - s/p IVA OD #1 (01.28.25), #2 (05.20.25), #3 (06.24.25), #4 (07.29.25)  - pt reported ~6 mo history of decreased vision OD  - OCT OD shows Central CNV with interval improvement in central [facility] and foveal contour, no fluid. Central ORA at 6 weeks  - BCVA OD 20/60 from 20/70   - recommend IVA OD #5 today, 09.09.25 with follow up extended to 7 weeks  - pt wishes to be treated with IVA  - RBA of procedure discussed, questions answered - IVA informed consent obtained and signed, 01.28.25 - see procedure note  - f/u in 7 wks, DFE, OCT  2. Age related macular degeneration, non-exudative OS  - The incidence, anatomy, and pathology of dry AMD, risk of progression, and the AREDS and AREDS 2 study including smoking risks discussed with patient.  - Recommend amsler grid  monitoring  - monitor  3,4. Hypertensive retinopathy OU - discussed importance of tight BP control - monitor  5. Mixed Cataract OS - The symptoms of cataract, surgical options, and treatments and risks were discussed with patient. - discussed diagnosis and progression - under the expert management of Dr. Marolyn Cleverly - clear from a retina standpoint to proceed with cataract surgery when both patient and surgeon are ready  6. Pseudophakia OD  - s/p CE/IOL OD (Dr. Cleverly, 07.15.25)  - IOL in good position, doing well  - monitor   Ophthalmic Meds Ordered this visit:  Meds ordered this encounter  Medications   Bevacizumab  (AVASTIN ) SOLN 1.25 mg     Return in about 7 weeks (around 09/12/2024) for exu ARMD OD, DFE, OCT, Possible Injxn.  There are no Patient Instructions on file for this visit.  Explained the diagnoses, plan, and follow up with the patient and they expressed understanding.  Patient expressed understanding of the importance of proper follow up care.   This document serves as a record of services personally performed by Redell JUDITHANN Hans, MD, PhD. It  was created on their behalf by Wanda GEANNIE Keens, COT an ophthalmic technician. The creation of this record is the provider's dictation and/or activities during the visit.    Electronically signed by:  Wanda GEANNIE Keens, COT  07/25/24 8:45 AM  This document serves as a record of services personally performed by Redell JUDITHANN Hans, MD, PhD. It was created on their behalf by Almetta Pesa, an ophthalmic technician. The creation of this record is the provider's dictation and/or activities during the visit.    Electronically signed by: Almetta Pesa, OA, 07/25/24  8:45 AM  Redell JUDITHANN Hans, M.D., Ph.D. Diseases & Surgery of the Retina and Vitreous [facility]  I have reviewed the above documentation for accuracy and completeness, and I agree with the above. Redell JUDITHANN Hans, M.D., Ph.D. 07/25/24  8:47 AM   Abbreviations: M myopia (nearsighted); A astigmatism; H hyperopia (farsighted); P presbyopia; Mrx spectacle prescription;  CTL contact lenses; OD right eye; OS left eye; OU both eyes  XT exotropia; ET esotropia; PEK punctate epithelial keratitis; PEE punctate epithelial erosions; DES dry eye syndrome; MGD meibomian gland dysfunction; ATs artificial tears; PFAT's preservative free artificial tears; NSC nuclear sclerotic cataract; PSC posterior subcapsular cataract; ERM epi-retinal membrane; PVD posterior vitreous detachment; RD retinal detachment; DM diabetes mellitus; DR diabetic retinopathy; NPDR non-proliferative diabetic retinopathy; PDR proliferative diabetic retinopathy; CSME clinically significant macular edema; DME diabetic macular edema; dbh dot blot hemorrhages; CWS cotton wool spot; POAG primary open angle glaucoma; C/D cup-to-disc ratio; HVF humphrey visual field; GVF goldmann visual field; OCT optical coherence tomography; IOP intraocular pressure; BRVO Branch retinal vein occlusion; CRVO central retinal vein occlusion; CRAO central retinal artery occlusion; BRAO branch retinal artery occlusion; RT retinal tear; SB scleral buckle; PPV pars plana vitrectomy; VH Vitreous hemorrhage; PRP panretinal laser photocoagulation; IVK intravitreal kenalog; VMT vitreomacular traction; MH Macular hole;  NVD neovascularization of the disc; NVE neovascularization elsewhere; AREDS age related eye disease study; ARMD age related macular degeneration; POAG primary open angle glaucoma; EBMD epithelial/anterior basement membrane dystrophy; ACIOL anterior chamber intraocular lens; IOL intraocular lens; PCIOL posterior chamber intraocular lens; Phaco/IOL phacoemulsification with intraocular lens placement; PRK photorefractive keratectomy; LASIK laser assisted in situ keratomileusis; HTN hypertension; DM diabetes mellitus; COPD chronic obstructive pulmonary disease

## 2024-07-18 DIAGNOSIS — E291 Testicular hypofunction: Secondary | ICD-10-CM | POA: Diagnosis not present

## 2024-07-25 ENCOUNTER — Ambulatory Visit (INDEPENDENT_AMBULATORY_CARE_PROVIDER_SITE_OTHER): Admitting: Ophthalmology

## 2024-07-25 ENCOUNTER — Encounter (INDEPENDENT_AMBULATORY_CARE_PROVIDER_SITE_OTHER): Payer: Self-pay | Admitting: Ophthalmology

## 2024-07-25 DIAGNOSIS — H25812 Combined forms of age-related cataract, left eye: Secondary | ICD-10-CM

## 2024-07-25 DIAGNOSIS — H353121 Nonexudative age-related macular degeneration, left eye, early dry stage: Secondary | ICD-10-CM

## 2024-07-25 DIAGNOSIS — H35033 Hypertensive retinopathy, bilateral: Secondary | ICD-10-CM | POA: Diagnosis not present

## 2024-07-25 DIAGNOSIS — Z961 Presence of intraocular lens: Secondary | ICD-10-CM | POA: Diagnosis not present

## 2024-07-25 DIAGNOSIS — H353211 Exudative age-related macular degeneration, right eye, with active choroidal neovascularization: Secondary | ICD-10-CM | POA: Diagnosis not present

## 2024-07-25 DIAGNOSIS — I10 Essential (primary) hypertension: Secondary | ICD-10-CM

## 2024-07-25 MED ORDER — BEVACIZUMAB CHEMO INJECTION 1.25MG/0.05ML SYRINGE FOR KALEIDOSCOPE
1.2500 mg | INTRAVITREAL | Status: AC | PRN
  Administered 2024-07-25: 1.25 mg via INTRAVITREAL

## 2024-08-01 DIAGNOSIS — E291 Testicular hypofunction: Secondary | ICD-10-CM | POA: Diagnosis not present

## 2024-08-15 DIAGNOSIS — E291 Testicular hypofunction: Secondary | ICD-10-CM | POA: Diagnosis not present

## 2024-08-29 DIAGNOSIS — E291 Testicular hypofunction: Secondary | ICD-10-CM | POA: Diagnosis not present

## 2024-08-31 NOTE — Progress Notes (Shared)
 [facility] - Clinic Note  09/12/2024   CHIEF COMPLAINT Patient presents for No chief complaint on file.  HISTORY OF PRESENT ILLNESS: Brandon [name] is a 83 y.o. male who presents to the clinic today for:    Pt states does feel like [facility] has improved OD. Pt was given a new rx from Dr. Juli.   Referring physician: Maree Isles, MD [address] [place],  [place] [address]  HISTORICAL INFORMATION:  Selected notes from the MEDICAL RECORD NUMBER Referred by Dr. Juli for macular degeneration LEE:  Ocular Hx- PMH-   CURRENT MEDICATIONS: No current outpatient medications on file. (Ophthalmic Drugs)   No current facility-administered medications for this visit. (Ophthalmic Drugs)   Current Outpatient Medications (Other)  Medication Sig   aspirin  325 MG tablet Take 1 tablet (325 mg total) by mouth daily.   b complex vitamins capsule Take 1 capsule by mouth 2 (two) times daily.   clobetasol (OLUX) 0.05 % topical foam Apply 1 application topically 2 (two) times daily as needed (rash).    clobetasol cream (TEMOVATE) 0.05 % Apply topically 2 (two) times daily.   famotidine (PEPCID) 10 MG tablet Take 10 mg by mouth 2 (two) times daily as needed for heartburn or indigestion.    loperamide  (IMODIUM  A-D) 2 MG tablet Take 1 tablet (2 mg total) by mouth daily as needed for diarrhea or loose stools.   nitroGLYCERIN (NITROSTAT) 0.4 MG SL tablet Place 0.4 mg under the tongue every 5 (five) minutes as needed for chest pain.    Omega-3 Krill Oil 450 MG CAPS Take by mouth daily. This is Omega 4  Krill Oil.   OVER THE COUNTER MEDICATION Take 10 drops by mouth daily. Liquid Zinc   OVER THE COUNTER MEDICATION Take 14 drops by mouth daily. Vitamin d3 and k2 combination.   simvastatin (ZOCOR) 20 MG tablet Take 20 mg by mouth at bedtime.    tamsulosin  (FLOMAX ) 0.4 MG CAPS capsule Take 1 capsule (0.4 mg total) by mouth daily.   vitamin B-12 (CYANOCOBALAMIN) 1000 MCG tablet Take 1,000 mcg  by mouth daily.   No current facility-administered medications for this visit. (Other)   REVIEW OF SYSTEMS:   ALLERGIES Allergies  Allergen Reactions   Oxycodone    Oxycontin [Oxycodone Hcl]     Hallucinations    PAST MEDICAL HISTORY Past Medical History:  Diagnosis Date   Chest pain, unspecified    Colon cancer (HCC) 1999   Coronary artery disease    GERD (gastroesophageal reflux disease)    History of gout    History of herniorrhaphy    Hypercholesteremia    Hypertension    Past Surgical History:  Procedure Laterality Date   AMPUTATION OF REPLICATED TOES Right    big toe   APPENDECTOMY     BALLOON DILATION N/A 08/01/2018   Procedure: BALLOON DILATION;  Surgeon: Golda Claudis PENNER, MD;  [facility];  Service: Endoscopy;  Laterality: N/A;   BILIARY STENT PLACEMENT N/A 06/15/2018   Procedure: BILIARY STENT PLACEMENT;  Surgeon: Golda Claudis PENNER, MD;  [facility]: [facility];  Service: Endoscopy;  Laterality: N/A;   CARDIAC CATHETERIZATION     stents x2   CATARACT EXTRACTION W/PHACO Right 05/30/2024   Procedure: PHACOEMULSIFICATION, CATARACT, WITH IOL INSERTION;  Surgeon: Juli Blunt, MD;  Location: [facility];  Service: Ophthalmology;  Laterality: Right;  CDE: 11.65   CHOLECYSTECTOMY N/A 06/17/2018   Procedure: ATTEMPTED LAPAROSCOPIC CHOLECYSTECTOMY,;  Surgeon: Mavis Anes, MD;  Location: [facility];  Service: General;  Laterality: N/A;   CHOLECYSTECTOMY N/A 06/17/2018   Procedure: CHOLECYSTECTOMY;  Surgeon: Mavis Anes, MD;  Location: [facility];  Service: General;  Laterality: N/A;   COLONOSCOPY N/A 11/21/2014   Procedure: COLONOSCOPY;  Surgeon: Claudis RAYMOND Rivet, MD;  [facility];  Service: Endoscopy;  Laterality: N/A;  830   CORONARY ANGIOPLASTY     ERCP N/A 06/15/2018   Procedure: ENDOSCOPIC RETROGRADE CHOLANGIOPANCREATOGRAPHY (ERCP);  Surgeon: Rivet Claudis RAYMOND, MD;  [facility]: [facility];  Service: Endoscopy;  Laterality: N/A;   ERCP N/A 08/01/2018    Procedure: ENDOSCOPIC RETROGRADE CHOLANGIOPANCREATOGRAPHY (ERCP);  Surgeon: Rivet Claudis RAYMOND, MD;  [facility];  Service: Endoscopy;  Laterality: N/A;   heart stents     HERNIA REPAIR     Incisional hernia   INGUINAL HERNIA REPAIR Right 09/02/2016   Procedure: HERNIA REPAIR INGUINAL ADULT WITH MESH;  Surgeon: Anes Mavis, MD;  Location: [facility];  Service: General;  Laterality: Right;   Left foot surgery Left    lawn mower accidnet   PARTIAL COLECTOMY     REMOVAL OF STONES N/A 08/01/2018   Procedure: REMOVAL OF STONES;  Surgeon: Rivet Claudis RAYMOND, MD;  [facility];  Service: Endoscopy;  Laterality: N/A;   Right great toe surgery     SPHINCTEROTOMY N/A 06/15/2018   Procedure: SPHINCTEROTOMY;  Surgeon: Rivet Claudis RAYMOND, MD;  [facility];  Service: Endoscopy;  Laterality: N/A;   SPHINCTEROTOMY N/A 08/01/2018   Procedure: SPHINCTEROTOMY;  Surgeon: Rivet Claudis RAYMOND, MD;  [facility];  Service: Endoscopy;  Laterality: N/A;   SPYGLASS CHOLANGIOSCOPY N/A 08/01/2018   Procedure: SPYGLASS CHOLANGIOSCOPY;  Surgeon: Rivet Claudis RAYMOND, MD;  [facility];  Service: Endoscopy;  Laterality: N/A;   STENT REMOVAL N/A 08/01/2018   Procedure: BILIARY STENT REMOVAL;  Surgeon: Rivet Claudis RAYMOND, MD;  [facility];  Service: Endoscopy;  Laterality: N/A;   TONSILLECTOMY     FAMILY HISTORY Family History  Problem Relation Age of Onset   Cerebral aneurysm Mother    Alzheimer's disease Father    SOCIAL HISTORY Social History   Tobacco Use   Smoking status: Former    Current packs/day: 0.00    Average packs/day: 1 pack/day for 30.0 years (30.0 ttl pk-yrs)    Types: Cigarettes    Start date: 08/29/1979    Quit date: 08/28/2009    Years since quitting: 15.0   Smokeless tobacco: Current    Types: Snuff  Vaping Use   Vaping status: Never Used  Substance Use Topics   Alcohol  use: Yes    Comment: socially   Drug use: No       OPHTHALMIC  EXAM:  Not recorded    IMAGING AND PROCEDURES  Imaging and Procedures for 09/12/2024         ASSESSMENT/PLAN: No diagnosis found.  1. Exudative age related macular degeneration, OD  - lost to follow up from 4 weeks to 16 weeks (01.28.25 - 05.20.25) due to illness  - s/p IVA OD #1 (01.28.25), #2 (05.20.25), #3 (06.24.25), #4 (07.29.25), #5 (09.09.25)  - pt reported ~6 mo history of decreased vision OD  - OCT OD shows Central CNV with interval improvement in central [facility] and foveal contour, no fluid. Central ORA at 6 weeks  - BCVA OD 20/60 from 20/70   - recommend IVA OD #6 today, 10.28.25 with follow up extended to 7 weeks  - pt  wishes to be treated with IVA  - RBA of procedure discussed, questions answered - IVA informed consent obtained and signed, 01.28.25 - see procedure note  - f/u in 7 wks, DFE, OCT  2. Age related macular degeneration, non-exudative OS  - The incidence, anatomy, and pathology of dry AMD, risk of progression, and the AREDS and AREDS 2 study including smoking risks discussed with patient.  - Recommend amsler grid monitoring  - monitor  3,4. Hypertensive retinopathy OU - discussed importance of tight BP control - monitor  5. Mixed Cataract OS - The symptoms of cataract, surgical options, and treatments and risks were discussed with patient. - discussed diagnosis and progression - under the expert management of Dr. Marolyn Cleverly - clear from a retina standpoint to proceed with cataract surgery when both patient and surgeon are ready  6. Pseudophakia OD  - s/p CE/IOL OD (Dr. Cleverly, 07.15.25)  - IOL in good position, doing well  - monitor   Ophthalmic Meds Ordered this visit:  No orders of the defined types were placed in this encounter.    No follow-ups on file.  There are no Patient Instructions on file for this visit.  Explained the diagnoses, plan, and follow up with the patient and they expressed understanding.  Patient expressed  understanding of the importance of proper follow up care.   This document serves as a record of services personally performed by Redell JUDITHANN Hans, MD, PhD. It was created on their behalf by Wanda GEANNIE Keens, COT an ophthalmic technician. The creation of this record is the provider's dictation and/or activities during the visit.    Electronically signed by:  Wanda GEANNIE Keens, COT  08/31/24 7:18 AM   Redell JUDITHANN Hans, M.D., Ph.D. Diseases & Surgery of the Retina and Vitreous [facility]    Abbreviations: M myopia (nearsighted); A astigmatism; H hyperopia (farsighted); P presbyopia; Mrx spectacle prescription;  CTL contact lenses; OD right eye; OS left eye; OU both eyes  XT exotropia; ET esotropia; PEK punctate epithelial keratitis; PEE punctate epithelial erosions; DES dry eye syndrome; MGD meibomian gland dysfunction; ATs artificial tears; PFAT's preservative free artificial tears; NSC nuclear sclerotic cataract; PSC posterior subcapsular cataract; ERM epi-retinal membrane; PVD posterior vitreous detachment; RD retinal detachment; DM diabetes mellitus; DR diabetic retinopathy; NPDR non-proliferative diabetic retinopathy; PDR proliferative diabetic retinopathy; CSME clinically significant macular edema; DME diabetic macular edema; dbh dot blot hemorrhages; CWS cotton wool spot; POAG primary open angle glaucoma; C/D cup-to-disc ratio; HVF humphrey visual field; GVF goldmann visual field; OCT optical coherence tomography; IOP intraocular pressure; BRVO Branch retinal vein occlusion; CRVO central retinal vein occlusion; CRAO central retinal artery occlusion; BRAO branch retinal artery occlusion; RT retinal tear; SB scleral buckle; PPV pars plana vitrectomy; VH Vitreous hemorrhage; PRP panretinal laser photocoagulation; IVK intravitreal kenalog; VMT vitreomacular traction; MH Macular hole;  NVD neovascularization of the disc; NVE neovascularization elsewhere; AREDS age related eye  disease study; ARMD age related macular degeneration; POAG primary open angle glaucoma; EBMD epithelial/anterior basement membrane dystrophy; ACIOL anterior chamber intraocular lens; IOL intraocular lens; PCIOL posterior chamber intraocular lens; Phaco/IOL phacoemulsification with intraocular lens placement; PRK photorefractive keratectomy; LASIK laser assisted in situ keratomileusis; HTN hypertension; DM diabetes mellitus; COPD chronic obstructive pulmonary disease

## 2024-09-12 ENCOUNTER — Encounter (INDEPENDENT_AMBULATORY_CARE_PROVIDER_SITE_OTHER): Admitting: Ophthalmology

## 2024-09-12 DIAGNOSIS — E291 Testicular hypofunction: Secondary | ICD-10-CM | POA: Diagnosis not present

## 2024-09-12 DIAGNOSIS — Z961 Presence of intraocular lens: Secondary | ICD-10-CM

## 2024-09-12 DIAGNOSIS — H353211 Exudative age-related macular degeneration, right eye, with active choroidal neovascularization: Secondary | ICD-10-CM

## 2024-09-12 DIAGNOSIS — H35033 Hypertensive retinopathy, bilateral: Secondary | ICD-10-CM

## 2024-09-12 DIAGNOSIS — I10 Essential (primary) hypertension: Secondary | ICD-10-CM

## 2024-09-12 DIAGNOSIS — H25812 Combined forms of age-related cataract, left eye: Secondary | ICD-10-CM

## 2024-09-12 DIAGNOSIS — H353121 Nonexudative age-related macular degeneration, left eye, early dry stage: Secondary | ICD-10-CM

## 2024-09-18 NOTE — Progress Notes (Signed)
 [facility] - Clinic Note  09/19/2024   CHIEF COMPLAINT Patient presents for Retina Follow Up  HISTORY OF PRESENT ILLNESS: Brandon [name] is a 83 y.o. male who presents to the clinic today for:  HPI     Retina Follow Up   Patient presents with  Wet AMD.  In right eye.  This started 9 months ago.  Duration of 8 weeks.  I, the attending physician,  performed the HPI with the patient and updated documentation appropriately.        Comments   Pt states vision has been about the same. Pt had a stye develop in his RUL about 2 weeks ago causing him to delay his follow up. Pt has been washing his lids with baby shampoo and it has become better. Pt states his eyelids itch excessively and wanted to know if there is something he can do for them. PT is using refresh tid OU.      Last edited by Valdemar Rogue, MD on 09/19/2024  7:17 PM.    Pt states  Referring physician: Maree Isles, MD [address] [place],  [place] [address]  HISTORICAL INFORMATION:  Selected notes from the MEDICAL RECORD NUMBER Referred by Dr. Juli for macular degeneration LEE:  Ocular Hx- PMH-   CURRENT MEDICATIONS: No current outpatient medications on file. (Ophthalmic Drugs)   No current facility-administered medications for this visit. (Ophthalmic Drugs)   Current Outpatient Medications (Other)  Medication Sig   aspirin  325 MG tablet Take 1 tablet (325 mg total) by mouth daily.   b complex vitamins capsule Take 1 capsule by mouth 2 (two) times daily.   clobetasol (OLUX) 0.05 % topical foam Apply 1 application topically 2 (two) times daily as needed (rash).    clobetasol cream (TEMOVATE) 0.05 % Apply topically 2 (two) times daily.   famotidine (PEPCID) 10 MG tablet Take 10 mg by mouth 2 (two) times daily as needed for heartburn or indigestion.    loperamide  (IMODIUM  A-D) 2 MG tablet Take 1 tablet (2 mg total) by mouth daily as needed for diarrhea or loose stools.   nitroGLYCERIN (NITROSTAT)  0.4 MG SL tablet Place 0.4 mg under the tongue every 5 (five) minutes as needed for chest pain.    Omega-3 Krill Oil 450 MG CAPS Take by mouth daily. This is Omega 4  Krill Oil.   OVER THE COUNTER MEDICATION Take 10 drops by mouth daily. Liquid Zinc   OVER THE COUNTER MEDICATION Take 14 drops by mouth daily. Vitamin d3 and k2 combination.   simvastatin (ZOCOR) 20 MG tablet Take 20 mg by mouth at bedtime.    tamsulosin  (FLOMAX ) 0.4 MG CAPS capsule Take 1 capsule (0.4 mg total) by mouth daily.   vitamin B-12 (CYANOCOBALAMIN) 1000 MCG tablet Take 1,000 mcg by mouth daily.   No current facility-administered medications for this visit. (Other)   REVIEW OF SYSTEMS: ROS   Positive for: Gastrointestinal, Musculoskeletal, Cardiovascular, Eyes Last edited by Elnor Avelina RAMAN, COT on 09/19/2024  8:22 AM.      ALLERGIES Allergies  Allergen Reactions   Oxycodone    Oxycontin [Oxycodone Hcl]     Hallucinations    PAST MEDICAL HISTORY Past Medical History:  Diagnosis Date   Chest pain, unspecified    Colon cancer (HCC) 1999   Coronary artery disease    GERD (gastroesophageal reflux disease)    History of gout    History of herniorrhaphy    Hypercholesteremia  Hypertension    Past Surgical History:  Procedure Laterality Date   AMPUTATION OF REPLICATED TOES Right    big toe   APPENDECTOMY     BALLOON DILATION N/A 08/01/2018   Procedure: BALLOON DILATION;  Surgeon: Golda Claudis PENNER, MD;  [facility];  Service: Endoscopy;  Laterality: N/A;   BILIARY STENT PLACEMENT N/A 06/15/2018   Procedure: BILIARY STENT PLACEMENT;  Surgeon: Golda Claudis PENNER, MD;  [facility]: [facility];  Service: Endoscopy;  Laterality: N/A;   CARDIAC CATHETERIZATION     stents x2   CATARACT EXTRACTION W/PHACO Right 05/30/2024   Procedure: PHACOEMULSIFICATION, CATARACT, WITH IOL INSERTION;  Surgeon: Juli Blunt, MD;  Location: [facility];  Service: Ophthalmology;  Laterality: Right;  CDE: 11.65    CHOLECYSTECTOMY N/A 06/17/2018   Procedure: ATTEMPTED LAPAROSCOPIC CHOLECYSTECTOMY,;  Surgeon: Mavis Anes, MD;  Location: [facility];  Service: General;  Laterality: N/A;   CHOLECYSTECTOMY N/A 06/17/2018   Procedure: CHOLECYSTECTOMY;  Surgeon: Mavis Anes, MD;  Location: [facility];  Service: General;  Laterality: N/A;   COLONOSCOPY N/A 11/21/2014   Procedure: COLONOSCOPY;  Surgeon: Claudis PENNER Golda, MD;  [facility];  Service: Endoscopy;  Laterality: N/A;  830   CORONARY ANGIOPLASTY     ERCP N/A 06/15/2018   Procedure: ENDOSCOPIC RETROGRADE CHOLANGIOPANCREATOGRAPHY (ERCP);  Surgeon: Golda Claudis PENNER, MD;  [facility]: [facility];  Service: Endoscopy;  Laterality: N/A;   ERCP N/A 08/01/2018   Procedure: ENDOSCOPIC RETROGRADE CHOLANGIOPANCREATOGRAPHY (ERCP);  Surgeon: Golda Claudis PENNER, MD;  [facility]: [facility];  Service: Endoscopy;  Laterality: N/A;   heart stents     HERNIA REPAIR     Incisional hernia   INGUINAL HERNIA REPAIR Right 09/02/2016   Procedure: HERNIA REPAIR INGUINAL ADULT WITH MESH;  Surgeon: Anes Mavis, MD;  Location: [facility];  Service: General;  Laterality: Right;   Left foot surgery Left    lawn mower accidnet   PARTIAL COLECTOMY     REMOVAL OF STONES N/A 08/01/2018   Procedure: REMOVAL OF STONES;  Surgeon: Golda Claudis PENNER, MD;  [facility];  Service: Endoscopy;  Laterality: N/A;   Right great toe surgery     SPHINCTEROTOMY N/A 06/15/2018   Procedure: SPHINCTEROTOMY;  Surgeon: Golda Claudis PENNER, MD;  [facility];  Service: Endoscopy;  Laterality: N/A;   SPHINCTEROTOMY N/A 08/01/2018   Procedure: SPHINCTEROTOMY;  Surgeon: Golda Claudis PENNER, MD;  [facility];  Service: Endoscopy;  Laterality: N/A;   SPYGLASS CHOLANGIOSCOPY N/A 08/01/2018   Procedure: SPYGLASS CHOLANGIOSCOPY;  Surgeon: Golda Claudis PENNER, MD;  [facility];  Service: Endoscopy;  Laterality: N/A;   STENT REMOVAL N/A 08/01/2018   Procedure: BILIARY STENT REMOVAL;   Surgeon: Golda Claudis PENNER, MD;  [facility];  Service: Endoscopy;  Laterality: N/A;   TONSILLECTOMY     FAMILY HISTORY Family History  Problem Relation Age of Onset   Cerebral aneurysm Mother    Alzheimer's disease Father    SOCIAL HISTORY Social History   Tobacco Use   Smoking status: Former    Current packs/day: 0.00    Average packs/day: 1 pack/day for 30.0 years (30.0 ttl pk-yrs)    Types: Cigarettes    Start date: 08/29/1979    Quit date: 08/28/2009    Years since quitting: 15.0   Smokeless tobacco: Current    Types: Snuff  Vaping Use   Vaping status: Never Used  Substance Use Topics   Alcohol  use: Yes  Comment: socially   Drug use: No       OPHTHALMIC EXAM:  Base Eye Exam     Visual Acuity (Snellen - Linear)       Right Left   Dist [place] 20/60 (unable to see the 3 letters in the middle -3 20/20 -2   Dist ph [place] 20/60 +1          Tonometry (Tonopen, 8:29 AM)       Right Left   Pressure 13 16         Pupils       Pupils Dark Light Shape React APD   Right PERRL 3 2 Round Brisk None   Left PERRL 3 2 Round Brisk None         Visual Fields       Left Right    Full Full         Extraocular Movement       Right Left    Full, Ortho Full, Ortho         Neuro/Psych     Oriented x3: Yes   Mood/Affect: Normal         Dilation     Right eye: 1.0% Mydriacyl , 2.5% Phenylephrine  @ 8:31 AM  Pt requsted          Slit Lamp and Fundus Exam     Slit Lamp Exam       Right Left   Lids/Lashes Dermatochalasis - upper lid Dermatochalasis - upper lid   Conjunctiva/Sclera White and quiet White and quiet   Cornea Mild tear film debris, trace PEE, well healed cataract wound Mild tear film debris, 1+ Punctate epithelial erosions   Anterior Chamber deep, clear, narrow temporal angle deep, clear, narrow temporal angle   Iris Round and moderately dilated, TID IT quad Round and dilated   Lens PC IOL in good position, 1+ Posterior  capsular opacification 2-3+ Nuclear sclerosis, 3+ Cortical cataract   Anterior Vitreous mild syneresis, Posterior vitreous detachment mild syneresis, Posterior vitreous detachment         Fundus Exam       Right Left   Disc Pink and Sharp Pink and Sharp   C/D Ratio 0.2 0.3   Macula central CNV with edema--improved now w/ pigment clumping, punctate heme inferiorly -- stably improved Flat, Blunted foveal reflex, RPE mottling, No heme or edema   Vessels attenuated, Tortuous mild attenuation, mild tortuosity   Periphery Attached, reticular degeneration, No heme Attached, reticular degeneration, No heme           IMAGING AND PROCEDURES  Imaging and Procedures for 09/19/2024  OCT, Retina - OU - Both Eyes       Right Eye Quality was good. Central Foveal Thickness: 234. Progression has improved. Findings include normal foveal contour, no IRF, no SRF, subretinal hyper-reflective material, outer retinal atrophy (Central CNV with interval improvement in central SRHM, no fluid. Central ORA).   Left Eye Quality was good. Scan locations included temporal. Central Foveal Thickness: 281. Progression has been stable. Findings include normal foveal contour, no IRF, no SRF (Rare drusen).   Notes *Images captured and stored on drive  Diagnosis / Impression:  OD: exudative ARMD - Central CNV with interval improvement in central SRHM, no fluid. Central ORA OS: rare drusen -- nonexudative ARMD  Clinical management:  See below  Abbreviations: NFP - Normal foveal profile. CME - cystoid macular edema. PED - pigment epithelial detachment. IRF - intraretinal fluid. SRF - subretinal fluid. EZ -  ellipsoid zone. ERM - epiretinal membrane. ORA - outer retinal atrophy. ORT - outer retinal tubulation. SRHM - subretinal hyper-reflective material. IRHM - intraretinal hyper-reflective material      Intravitreal Injection, Pharmacologic Agent - OD - Right Eye       Time Out 09/19/2024. 8:05 AM. Confirmed  correct patient, procedure, site, and patient consented.   Anesthesia Topical anesthesia was used. Anesthetic medications included Lidocaine  2%, Proparacaine 0.5%.   Procedure Preparation included 5% betadine  to ocular surface, eyelid speculum. A supplied (32g) needle was used.   Injection: 1.25 mg Bevacizumab  1.25mg /0.20ml   Route: Intravitreal, Site: Right Eye   NDC: [id number], Lot: [id number], Expiration date: 10/01/2024   Post-op Post injection exam found visual acuity of at least counting fingers. The patient tolerated the procedure well. There were no complications. The patient received written and verbal post procedure care education.            ASSESSMENT/PLAN:   ICD-10-CM   1. Exudative age-related macular degeneration of right eye with active choroidal neovascularization (HCC)  H35.3211 OCT, Retina - OU - Both Eyes    Intravitreal Injection, Pharmacologic Agent - OD - Right Eye    Bevacizumab  (AVASTIN ) SOLN 1.25 mg    2. 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 Essential hypertension  I10     4. Hypertensive retinopathy of both eyes  H35.033     5. Combined forms of age-related cataract of left eye  H25.812     6. Pseudophakia  Z96.1       1. Exudative age related macular degeneration, OD  - lost to follow up from 4 weeks to 16 weeks (01.28.25 - 05.20.25) due to illness  - s/p IVA OD #1 (01.28.25), #2 (05.20.25), #3 (06.24.25), #4 (07.29.25), #5 (09.09.25)  - pt reported ~6 mo history of decreased vision OD  - OCT OD shows Central CNV with interval improvement in central SRHM, no fluid. Central ORA at 8 weeks  - BCVA OD 20/60 stable  - recommend IVA OD #6 today, 11.04.25 with follow up extended to 9-10 weeks  - pt wishes to be treated with IVA  - RBA of procedure discussed, questions answered - IVA informed consent obtained and signed, 01.28.25 - see procedure note  - f/u in 9-10 wks, DFE, OCT  2. Age  related macular degeneration, non-exudative OS  - The incidence, anatomy, and pathology of dry AMD, risk of progression, and the AREDS and AREDS 2 study including smoking risks discussed with patient.  - Recommend amsler grid monitoring  - monitor  3,4. Hypertensive retinopathy OU - discussed importance of tight BP control - monitor  5. Mixed Cataract OS - The symptoms of cataract, surgical options, and treatments and risks were discussed with patient. - discussed diagnosis and progression - under the expert management of Dr. Marolyn Cleverly - clear from a retina standpoint to proceed with cataract surgery when both patient and surgeon are ready  6. Pseudophakia OD  - s/p CE/IOL OD (Dr. Cleverly, 07.15.25)  - IOL in good position, doing well  - monitor   Ophthalmic Meds Ordered this visit:  Meds ordered this encounter  Medications   Bevacizumab  (AVASTIN ) SOLN 1.25 mg     Return for 9-10 wks exu ARMD OS, Dilated Exam, OCT, Possible Injxn.  There are no Patient Instructions on file for this visit.  Explained the diagnoses, plan, and follow up with the patient and they expressed understanding.  Patient expressed understanding of the importance of proper follow up care.   This document serves as a record of services personally performed by Redell JUDITHANN Hans, MD, PhD. It was created on their behalf by Wanda GEANNIE Keens, COT an ophthalmic technician. The creation of this record is the provider's dictation and/or activities during the visit.    Electronically signed by:  Wanda GEANNIE Keens, COT  09/19/24 7:18 PM  This document serves as a record of services personally performed by Redell JUDITHANN Hans, MD, PhD. It was created on their behalf by Almetta Pesa, an ophthalmic technician. The creation of this record is the provider's dictation and/or activities during the visit.    Electronically signed by: Almetta Pesa, OA, 09/19/24  7:18 PM  Redell JUDITHANN Hans, M.D., Ph.D. Diseases &  Surgery of the Retina and Vitreous [facility]  I have reviewed the above documentation for accuracy and completeness, and I agree with the above. Redell JUDITHANN Hans, M.D., Ph.D. 09/19/24 7:18 PM   Abbreviations: M myopia (nearsighted); A astigmatism; H hyperopia (farsighted); P presbyopia; Mrx spectacle prescription;  CTL contact lenses; OD right eye; OS left eye; OU both eyes  XT exotropia; ET esotropia; PEK punctate epithelial keratitis; PEE punctate epithelial erosions; DES dry eye syndrome; MGD meibomian gland dysfunction; ATs artificial tears; PFAT's preservative free artificial tears; NSC nuclear sclerotic cataract; PSC posterior subcapsular cataract; ERM epi-retinal membrane; PVD posterior vitreous detachment; RD retinal detachment; DM diabetes mellitus; DR diabetic retinopathy; NPDR non-proliferative diabetic retinopathy; PDR proliferative diabetic retinopathy; CSME clinically significant macular edema; DME diabetic macular edema; dbh dot blot hemorrhages; CWS cotton wool spot; POAG primary open angle glaucoma; C/D cup-to-disc ratio; HVF humphrey visual field; GVF goldmann visual field; OCT optical coherence tomography; IOP intraocular pressure; BRVO Branch retinal vein occlusion; CRVO central retinal vein occlusion; CRAO central retinal artery occlusion; BRAO branch retinal artery occlusion; RT retinal tear; SB scleral buckle; PPV pars plana vitrectomy; VH Vitreous hemorrhage; PRP panretinal laser photocoagulation; IVK intravitreal kenalog; VMT vitreomacular traction; MH Macular hole;  NVD neovascularization of the disc; NVE neovascularization elsewhere; AREDS age related eye disease study; ARMD age related macular degeneration; POAG primary open angle glaucoma; EBMD epithelial/anterior basement membrane dystrophy; ACIOL anterior chamber intraocular lens; IOL intraocular lens; PCIOL posterior chamber intraocular lens; Phaco/IOL phacoemulsification with intraocular lens placement;  PRK photorefractive keratectomy; LASIK laser assisted in situ keratomileusis; HTN hypertension; DM diabetes mellitus; COPD chronic obstructive pulmonary disease

## 2024-09-19 ENCOUNTER — Ambulatory Visit (INDEPENDENT_AMBULATORY_CARE_PROVIDER_SITE_OTHER): Admitting: Ophthalmology

## 2024-09-19 ENCOUNTER — Encounter (INDEPENDENT_AMBULATORY_CARE_PROVIDER_SITE_OTHER): Payer: Self-pay | Admitting: Ophthalmology

## 2024-09-19 DIAGNOSIS — I10 Essential (primary) hypertension: Secondary | ICD-10-CM | POA: Diagnosis not present

## 2024-09-19 DIAGNOSIS — H353121 Nonexudative age-related macular degeneration, left eye, early dry stage: Secondary | ICD-10-CM | POA: Diagnosis not present

## 2024-09-19 DIAGNOSIS — H35033 Hypertensive retinopathy, bilateral: Secondary | ICD-10-CM

## 2024-09-19 DIAGNOSIS — Z961 Presence of intraocular lens: Secondary | ICD-10-CM | POA: Diagnosis not present

## 2024-09-19 DIAGNOSIS — H25812 Combined forms of age-related cataract, left eye: Secondary | ICD-10-CM | POA: Diagnosis not present

## 2024-09-19 DIAGNOSIS — H353211 Exudative age-related macular degeneration, right eye, with active choroidal neovascularization: Secondary | ICD-10-CM

## 2024-09-19 MED ORDER — BEVACIZUMAB CHEMO INJECTION 1.25MG/0.05ML SYRINGE FOR KALEIDOSCOPE
1.2500 mg | INTRAVITREAL | Status: AC | PRN
  Administered 2024-09-19: 1.25 mg via INTRAVITREAL

## 2024-09-26 DIAGNOSIS — E291 Testicular hypofunction: Secondary | ICD-10-CM | POA: Diagnosis not present

## 2024-10-10 DIAGNOSIS — R7989 Other specified abnormal findings of blood chemistry: Secondary | ICD-10-CM | POA: Diagnosis not present

## 2024-10-24 DIAGNOSIS — E291 Testicular hypofunction: Secondary | ICD-10-CM | POA: Diagnosis not present

## 2024-11-07 NOTE — Progress Notes (Shared)
 " [facility] - Clinic Note  11/21/2024   CHIEF COMPLAINT Patient presents for No chief complaint on file.  HISTORY OF PRESENT ILLNESS: Brandon [name] is a 83 y.o. male who presents to the clinic today for:   Pt states  Referring physician: Maree Isles, MD [address] [place],  [place] [address]  HISTORICAL INFORMATION:  Selected notes from the MEDICAL RECORD NUMBER Referred by Dr. Juli for macular degeneration LEE:  Ocular Hx- PMH-   CURRENT MEDICATIONS: No current outpatient medications on file. (Ophthalmic Drugs)   No current facility-administered medications for this visit. (Ophthalmic Drugs)   Current Outpatient Medications (Other)  Medication Sig   aspirin  325 MG tablet Take 1 tablet (325 mg total) by mouth Brandon.   b complex vitamins capsule Take 1 capsule by mouth 2 (two) times Brandon.   clobetasol (OLUX) 0.05 % topical foam Apply 1 application topically 2 (two) times Brandon as needed (rash).    clobetasol cream (TEMOVATE) 0.05 % Apply topically 2 (two) times Brandon.   famotidine (PEPCID) 10 MG tablet Take 10 mg by mouth 2 (two) times Brandon as needed for heartburn or indigestion.    loperamide  (IMODIUM  A-D) 2 MG tablet Take 1 tablet (2 mg total) by mouth Brandon as needed for diarrhea or loose stools.   nitroGLYCERIN (NITROSTAT) 0.4 MG SL tablet Place 0.4 mg under the tongue every 5 (five) minutes as needed for chest pain.    Omega-3 Krill Oil 450 MG CAPS Take by mouth Brandon. This is Omega 4  Krill Oil.   OVER THE COUNTER MEDICATION Take 10 drops by mouth Brandon. Liquid Zinc   OVER THE COUNTER MEDICATION Take 14 drops by mouth Brandon. Vitamin d3 and k2 combination.   simvastatin (ZOCOR) 20 MG tablet Take 20 mg by mouth at bedtime.    tamsulosin  (FLOMAX ) 0.4 MG CAPS capsule Take 1 capsule (0.4 mg total) by mouth Brandon.   vitamin B-12 (CYANOCOBALAMIN) 1000 MCG tablet Take 1,000 mcg by mouth Brandon.   No current facility-administered medications for this visit.  (Other)   REVIEW OF SYSTEMS:    ALLERGIES Allergies  Allergen Reactions   Oxycodone    Oxycontin [Oxycodone Hcl]     Hallucinations    PAST MEDICAL HISTORY Past Medical History:  Diagnosis Date   Chest pain, unspecified    Colon cancer (HCC) 1999   Coronary artery disease    GERD (gastroesophageal reflux disease)    History of gout    History of herniorrhaphy    Hypercholesteremia    Hypertension    Past Surgical History:  Procedure Laterality Date   AMPUTATION OF REPLICATED TOES Right    big toe   APPENDECTOMY     BALLOON DILATION N/A 08/01/2018   Procedure: BALLOON DILATION;  Surgeon: Golda Claudis PENNER, MD;  [facility];  Service: Endoscopy;  Laterality: N/A;   BILIARY STENT PLACEMENT N/A 06/15/2018   Procedure: BILIARY STENT PLACEMENT;  Surgeon: Golda Claudis PENNER, MD;  [facility]: [facility];  Service: Endoscopy;  Laterality: N/A;   CARDIAC CATHETERIZATION     stents x2   CATARACT EXTRACTION W/PHACO Right 05/30/2024   Procedure: PHACOEMULSIFICATION, CATARACT, WITH IOL INSERTION;  Surgeon: Juli Blunt, MD;  Location: [facility];  Service: Ophthalmology;  Laterality: Right;  CDE: 11.65   CHOLECYSTECTOMY N/A 06/17/2018   Procedure: ATTEMPTED LAPAROSCOPIC CHOLECYSTECTOMY,;  Surgeon: Mavis Anes, MD;  Location: [facility];  Service: General;  Laterality: N/A;   CHOLECYSTECTOMY N/A 06/17/2018  Procedure: CHOLECYSTECTOMY;  Surgeon: Mavis Anes, MD;  Location: [facility];  Service: General;  Laterality: N/A;   COLONOSCOPY N/A 11/21/2014   Procedure: COLONOSCOPY;  Surgeon: Claudis RAYMOND Rivet, MD;  [facility]: [facility];  Service: Endoscopy;  Laterality: N/A;  830   CORONARY ANGIOPLASTY     ERCP N/A 06/15/2018   Procedure: ENDOSCOPIC RETROGRADE CHOLANGIOPANCREATOGRAPHY (ERCP);  Surgeon: Rivet Claudis RAYMOND, MD;  [facility]: [facility];  Service: Endoscopy;  Laterality: N/A;   ERCP N/A 08/01/2018   Procedure: ENDOSCOPIC RETROGRADE CHOLANGIOPANCREATOGRAPHY (ERCP);  Surgeon:  Rivet Claudis RAYMOND, MD;  [facility]: [facility];  Service: Endoscopy;  Laterality: N/A;   heart stents     HERNIA REPAIR     Incisional hernia   INGUINAL HERNIA REPAIR Right 09/02/2016   Procedure: HERNIA REPAIR INGUINAL ADULT WITH MESH;  Surgeon: Anes Mavis, MD;  Location: [facility];  Service: General;  Laterality: Right;   Left foot surgery Left    lawn mower accidnet   PARTIAL COLECTOMY     REMOVAL OF STONES N/A 08/01/2018   Procedure: REMOVAL OF STONES;  Surgeon: Rivet Claudis RAYMOND, MD;  [facility]: [facility];  Service: Endoscopy;  Laterality: N/A;   Right great toe surgery     SPHINCTEROTOMY N/A 06/15/2018   Procedure: SPHINCTEROTOMY;  Surgeon: Rivet Claudis RAYMOND, MD;  [facility];  Service: Endoscopy;  Laterality: N/A;   SPHINCTEROTOMY N/A 08/01/2018   Procedure: SPHINCTEROTOMY;  Surgeon: Rivet Claudis RAYMOND, MD;  [facility];  Service: Endoscopy;  Laterality: N/A;   SPYGLASS CHOLANGIOSCOPY N/A 08/01/2018   Procedure: SPYGLASS CHOLANGIOSCOPY;  Surgeon: Rivet Claudis RAYMOND, MD;  [facility];  Service: Endoscopy;  Laterality: N/A;   STENT REMOVAL N/A 08/01/2018   Procedure: BILIARY STENT REMOVAL;  Surgeon: Rivet Claudis RAYMOND, MD;  [facility];  Service: Endoscopy;  Laterality: N/A;   TONSILLECTOMY     FAMILY HISTORY Family History  Problem Relation Age of Onset   Cerebral aneurysm Mother    Alzheimer's disease Father    SOCIAL HISTORY Social History   Tobacco Use   Smoking status: Former    Current packs/day: 0.00    Average packs/day: 1 pack/day for 30.0 years (30.0 ttl pk-yrs)    Types: Cigarettes    Start date: 08/29/1979    Quit date: 08/28/2009    Years since quitting: 15.2   Smokeless tobacco: Current    Types: Snuff  Vaping Use   Vaping status: Never Used  Substance Use Topics   Alcohol  use: Yes    Comment: socially   Drug use: No       OPHTHALMIC EXAM:  Not recorded    IMAGING AND PROCEDURES  Imaging and Procedures for  11/21/2024          ASSESSMENT/PLAN: No diagnosis found.   1. Exudative age related macular degeneration, OD  - lost to follow up from 4 weeks to 16 weeks (01.28.25 - 05.20.25) due to illness  - s/p IVA OD #1 (01.28.25), #2 (05.20.25), #3 (06.24.25), #4 (07.29.25), #5 (09.09.25), #6 (11.04.25)  - pt reported ~6 mo history of decreased vision OD  - OCT OD shows Central CNV with interval improvement in central SRHM, no fluid. Central ORA at 8 weeks  - BCVA OD 20/60 stable  - recommend IVA OD #7 today, 01.06.26 with follow up extended to 9-10 weeks  - pt wishes to be treated with IVA  - RBA of procedure discussed, questions answered - IVA informed  consent obtained and signed, 01.28.25 - see procedure note  - f/u in 9-10 wks, DFE, OCT  2. Age related macular degeneration, non-exudative OS  - The incidence, anatomy, and pathology of dry AMD, risk of progression, and the AREDS and AREDS 2 study including smoking risks discussed with patient.  - Recommend amsler grid monitoring  - monitor  3,4. Hypertensive retinopathy OU - discussed importance of tight BP control - monitor  5. Mixed Cataract OS - The symptoms of cataract, surgical options, and treatments and risks were discussed with patient. - discussed diagnosis and progression - under the expert management of Dr. Marolyn Cleverly - clear from a retina standpoint to proceed with cataract surgery when both patient and surgeon are ready  6. Pseudophakia OD  - s/p CE/IOL OD (Dr. Cleverly, 07.15.25)  - IOL in good position, doing well  - monitor   Ophthalmic Meds Ordered this visit:  No orders of the defined types were placed in this encounter.    No follow-ups on file.  There are no Patient Instructions on file for this visit.  Explained the diagnoses, plan, and follow up with the patient and they expressed understanding.  Patient expressed understanding of the importance of proper follow up care.   This document serves as a  record of services personally performed by Redell JUDITHANN Hans, MD, PhD. It was created on their behalf by Wanda GEANNIE Keens, COT an ophthalmic technician. The creation of this record is the provider's dictation and/or activities during the visit.    Electronically signed by:  Wanda GEANNIE Keens, COT  11/07/2024 7:10 AM  Redell JUDITHANN Hans, M.D., Ph.D. Diseases & Surgery of the Retina and Vitreous [facility]   Abbreviations: M myopia (nearsighted); A astigmatism; H hyperopia (farsighted); P presbyopia; Mrx spectacle prescription;  CTL contact lenses; OD right eye; OS left eye; OU both eyes  XT exotropia; ET esotropia; PEK punctate epithelial keratitis; PEE punctate epithelial erosions; DES dry eye syndrome; MGD meibomian gland dysfunction; ATs artificial tears; PFAT's preservative free artificial tears; NSC nuclear sclerotic cataract; PSC posterior subcapsular cataract; ERM epi-retinal membrane; PVD posterior vitreous detachment; RD retinal detachment; DM diabetes mellitus; DR diabetic retinopathy; NPDR non-proliferative diabetic retinopathy; PDR proliferative diabetic retinopathy; CSME clinically significant macular edema; DME diabetic macular edema; dbh dot blot hemorrhages; CWS cotton wool spot; POAG primary open angle glaucoma; C/D cup-to-disc ratio; HVF humphrey visual field; GVF goldmann visual field; OCT optical coherence tomography; IOP intraocular pressure; BRVO Branch retinal vein occlusion; CRVO central retinal vein occlusion; CRAO central retinal artery occlusion; BRAO branch retinal artery occlusion; RT retinal tear; SB scleral buckle; PPV pars plana vitrectomy; VH Vitreous hemorrhage; PRP panretinal laser photocoagulation; IVK intravitreal kenalog; VMT vitreomacular traction; MH Macular hole;  NVD neovascularization of the disc; NVE neovascularization elsewhere; AREDS age related eye disease study; ARMD age related macular degeneration; POAG primary open angle glaucoma;  EBMD epithelial/anterior basement membrane dystrophy; ACIOL anterior chamber intraocular lens; IOL intraocular lens; PCIOL posterior chamber intraocular lens; Phaco/IOL phacoemulsification with intraocular lens placement; PRK photorefractive keratectomy; LASIK laser assisted in situ keratomileusis; HTN hypertension; DM diabetes mellitus; COPD chronic obstructive pulmonary disease  "

## 2024-11-21 ENCOUNTER — Encounter (INDEPENDENT_AMBULATORY_CARE_PROVIDER_SITE_OTHER): Admitting: Ophthalmology

## 2024-11-21 DIAGNOSIS — H35033 Hypertensive retinopathy, bilateral: Secondary | ICD-10-CM

## 2024-11-21 DIAGNOSIS — Z961 Presence of intraocular lens: Secondary | ICD-10-CM

## 2024-11-21 DIAGNOSIS — H353121 Nonexudative age-related macular degeneration, left eye, early dry stage: Secondary | ICD-10-CM

## 2024-11-21 DIAGNOSIS — I10 Essential (primary) hypertension: Secondary | ICD-10-CM

## 2024-11-21 DIAGNOSIS — H353211 Exudative age-related macular degeneration, right eye, with active choroidal neovascularization: Secondary | ICD-10-CM

## 2024-11-21 DIAGNOSIS — H25812 Combined forms of age-related cataract, left eye: Secondary | ICD-10-CM
